# Patient Record
Sex: Female | Born: 1960 | Race: White | Hispanic: No | Marital: Married | Smoking: Current every day smoker
Health system: Southern US, Community
[De-identification: ages and names within clinical notes are randomized; demographics above are authoritative.]

## PROBLEM LIST (undated history)

## (undated) ENCOUNTER — Emergency Department (HOSPITAL_COMMUNITY): Payer: No Typology Code available for payment source

## (undated) DIAGNOSIS — R109 Unspecified abdominal pain: Secondary | ICD-10-CM

## (undated) DIAGNOSIS — E785 Hyperlipidemia, unspecified: Secondary | ICD-10-CM

## (undated) DIAGNOSIS — S3992XA Unspecified injury of lower back, initial encounter: Secondary | ICD-10-CM

## (undated) DIAGNOSIS — K219 Gastro-esophageal reflux disease without esophagitis: Secondary | ICD-10-CM

## (undated) DIAGNOSIS — F329 Major depressive disorder, single episode, unspecified: Secondary | ICD-10-CM

## (undated) DIAGNOSIS — I10 Essential (primary) hypertension: Secondary | ICD-10-CM

## (undated) DIAGNOSIS — Z90710 Acquired absence of both cervix and uterus: Secondary | ICD-10-CM

## (undated) DIAGNOSIS — T8579XA Infection and inflammatory reaction due to other internal prosthetic devices, implants and grafts, initial encounter: Secondary | ICD-10-CM

## (undated) DIAGNOSIS — E739 Lactose intolerance, unspecified: Secondary | ICD-10-CM

## (undated) DIAGNOSIS — K859 Acute pancreatitis without necrosis or infection, unspecified: Secondary | ICD-10-CM

## (undated) DIAGNOSIS — M19041 Primary osteoarthritis, right hand: Secondary | ICD-10-CM

## (undated) DIAGNOSIS — J189 Pneumonia, unspecified organism: Secondary | ICD-10-CM

## (undated) DIAGNOSIS — M25552 Pain in left hip: Secondary | ICD-10-CM

## (undated) DIAGNOSIS — Z978 Presence of other specified devices: Secondary | ICD-10-CM

## (undated) DIAGNOSIS — Z87898 Personal history of other specified conditions: Secondary | ICD-10-CM

## (undated) DIAGNOSIS — M797 Fibromyalgia: Secondary | ICD-10-CM

## (undated) DIAGNOSIS — G8929 Other chronic pain: Secondary | ICD-10-CM

## (undated) DIAGNOSIS — I499 Cardiac arrhythmia, unspecified: Secondary | ICD-10-CM

## (undated) DIAGNOSIS — M19042 Primary osteoarthritis, left hand: Secondary | ICD-10-CM

## (undated) DIAGNOSIS — J45909 Unspecified asthma, uncomplicated: Secondary | ICD-10-CM

## (undated) DIAGNOSIS — E669 Obesity, unspecified: Secondary | ICD-10-CM

## (undated) DIAGNOSIS — M75121 Complete rotator cuff tear or rupture of right shoulder, not specified as traumatic: Secondary | ICD-10-CM

## (undated) DIAGNOSIS — L511 Stevens-Johnson syndrome: Secondary | ICD-10-CM

## (undated) DIAGNOSIS — R32 Unspecified urinary incontinence: Secondary | ICD-10-CM

## (undated) DIAGNOSIS — Z0000 Encounter for general adult medical examination without abnormal findings: Secondary | ICD-10-CM

## (undated) DIAGNOSIS — N393 Stress incontinence (female) (male): Secondary | ICD-10-CM

## (undated) DIAGNOSIS — K602 Anal fissure, unspecified: Secondary | ICD-10-CM

## (undated) DIAGNOSIS — G971 Other reaction to spinal and lumbar puncture: Secondary | ICD-10-CM

## (undated) DIAGNOSIS — A0472 Enterocolitis due to Clostridium difficile, not specified as recurrent: Secondary | ICD-10-CM

## (undated) DIAGNOSIS — Z8371 Family history of colonic polyps: Secondary | ICD-10-CM

## (undated) DIAGNOSIS — R0789 Other chest pain: Secondary | ICD-10-CM

## (undated) DIAGNOSIS — F320 Major depressive disorder, single episode, mild: Secondary | ICD-10-CM

## (undated) DIAGNOSIS — F32A Depression, unspecified: Secondary | ICD-10-CM

## (undated) DIAGNOSIS — J320 Chronic maxillary sinusitis: Secondary | ICD-10-CM

## (undated) HISTORY — DX: Encounter for general adult medical examination without abnormal findings: Z00.00

## (undated) HISTORY — DX: Unspecified asthma, uncomplicated: J45.909

## (undated) HISTORY — DX: Fibromyalgia: M79.7

## (undated) HISTORY — DX: Pneumonia, unspecified organism: J18.9

## (undated) HISTORY — DX: Anal fissure, unspecified: K60.2

## (undated) HISTORY — DX: Hyperlipidemia, unspecified: E78.5

## (undated) HISTORY — DX: Acute pancreatitis without necrosis or infection, unspecified: K85.90

## (undated) HISTORY — DX: Presence of other specified devices: Z97.8

## (undated) HISTORY — DX: Enterocolitis due to Clostridium difficile, not specified as recurrent: A04.72

## (undated) HISTORY — DX: Acquired absence of both cervix and uterus: Z90.710

## (undated) HISTORY — DX: Chronic maxillary sinusitis: J32.0

## (undated) HISTORY — DX: Stevens-Johnson syndrome: L51.1

## (undated) HISTORY — DX: Lactose intolerance, unspecified: E73.9

## (undated) HISTORY — DX: Gastro-esophageal reflux disease without esophagitis: K21.9

## (undated) HISTORY — DX: Unspecified injury of lower back, initial encounter: S39.92XA

## (undated) HISTORY — DX: Primary osteoarthritis, left hand: M19.042

## (undated) HISTORY — DX: Major depressive disorder, single episode, unspecified: F32.9

## (undated) HISTORY — DX: Major depressive disorder, single episode, mild: F32.0

## (undated) HISTORY — DX: Other chronic pain: G89.29

## (undated) HISTORY — DX: Unspecified urinary incontinence: R32

## (undated) HISTORY — DX: Primary osteoarthritis, right hand: M19.041

## (undated) HISTORY — DX: Complete rotator cuff tear or rupture of right shoulder, not specified as traumatic: M75.121

## (undated) HISTORY — DX: Other chest pain: R07.89

## (undated) HISTORY — DX: Depression, unspecified: F32.A

## (undated) HISTORY — DX: Family history of colonic polyps: Z83.71

## (undated) HISTORY — DX: Unspecified abdominal pain: R10.9

## (undated) HISTORY — DX: Stress incontinence (female) (male): N39.3

## (undated) HISTORY — DX: Pain in left hip: M25.552

## (undated) HISTORY — DX: Personal history of other specified conditions: Z87.898

## (undated) HISTORY — DX: Obesity, unspecified: E66.9

## (undated) HISTORY — DX: Infection and inflammatory reaction due to other internal prosthetic devices, implants and grafts, initial encounter: T85.79XA

---
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
## 1980-03-01 DIAGNOSIS — L511 Stevens-Johnson syndrome: Secondary | ICD-10-CM

## 1980-03-01 HISTORY — DX: Stevens-Johnson syndrome: L51.1

## 1983-03-02 HISTORY — PX: ANAL FISSURE REPAIR: SHX2312

## 1985-03-01 HISTORY — PX: APPENDECTOMY: SHX54

## 1988-03-01 DIAGNOSIS — S3992XA Unspecified injury of lower back, initial encounter: Secondary | ICD-10-CM

## 1988-03-01 DIAGNOSIS — G971 Other reaction to spinal and lumbar puncture: Secondary | ICD-10-CM

## 1988-03-01 HISTORY — DX: Other reaction to spinal and lumbar puncture: G97.1

## 1988-03-01 HISTORY — DX: Unspecified injury of lower back, initial encounter: S39.92XA

## 1989-03-01 HISTORY — PX: CHOLECYSTECTOMY: SHX55

## 1995-03-02 HISTORY — PX: ABDOMINOPLASTY: SUR9

## 1995-03-02 HISTORY — PX: BREAST ENHANCEMENT SURGERY: SHX7

## 2001-03-09 ENCOUNTER — Emergency Department (HOSPITAL_COMMUNITY): Admission: EM | Admit: 2001-03-09 | Discharge: 2001-03-09 | Payer: Self-pay | Admitting: Emergency Medicine

## 2001-03-09 ENCOUNTER — Encounter: Payer: Self-pay | Admitting: Emergency Medicine

## 2001-03-29 ENCOUNTER — Encounter: Payer: Self-pay | Admitting: Urology

## 2001-03-29 ENCOUNTER — Encounter: Admission: RE | Admit: 2001-03-29 | Discharge: 2001-03-29 | Payer: Self-pay | Admitting: Urology

## 2001-12-29 ENCOUNTER — Other Ambulatory Visit: Admission: RE | Admit: 2001-12-29 | Discharge: 2001-12-29 | Payer: Self-pay | Admitting: *Deleted

## 2004-07-03 ENCOUNTER — Encounter: Admission: RE | Admit: 2004-07-03 | Discharge: 2004-07-03 | Payer: Self-pay | Admitting: Obstetrics and Gynecology

## 2004-09-21 ENCOUNTER — Other Ambulatory Visit: Admission: RE | Admit: 2004-09-21 | Discharge: 2004-09-21 | Payer: Self-pay | Admitting: Obstetrics and Gynecology

## 2006-05-06 ENCOUNTER — Encounter: Payer: Self-pay | Admitting: Family Medicine

## 2006-05-16 ENCOUNTER — Encounter: Payer: Self-pay | Admitting: Family Medicine

## 2007-08-04 ENCOUNTER — Encounter: Admission: RE | Admit: 2007-08-04 | Discharge: 2007-08-04 | Payer: Self-pay | Admitting: Obstetrics and Gynecology

## 2007-08-04 ENCOUNTER — Encounter: Payer: Self-pay | Admitting: Family Medicine

## 2008-04-22 ENCOUNTER — Ambulatory Visit: Payer: Self-pay | Admitting: Radiology

## 2008-04-22 ENCOUNTER — Emergency Department (HOSPITAL_BASED_OUTPATIENT_CLINIC_OR_DEPARTMENT_OTHER): Admission: EM | Admit: 2008-04-22 | Discharge: 2008-04-22 | Payer: Self-pay | Admitting: Emergency Medicine

## 2009-03-01 HISTORY — PX: BREAST BIOPSY: SHX20

## 2009-05-01 ENCOUNTER — Ambulatory Visit: Payer: Self-pay | Admitting: Diagnostic Radiology

## 2009-05-01 ENCOUNTER — Emergency Department (HOSPITAL_BASED_OUTPATIENT_CLINIC_OR_DEPARTMENT_OTHER): Admission: EM | Admit: 2009-05-01 | Discharge: 2009-05-01 | Payer: Self-pay | Admitting: Emergency Medicine

## 2009-05-01 ENCOUNTER — Encounter: Payer: Self-pay | Admitting: Family Medicine

## 2009-05-02 ENCOUNTER — Encounter: Payer: Self-pay | Admitting: Family Medicine

## 2009-05-02 ENCOUNTER — Encounter: Admission: RE | Admit: 2009-05-02 | Discharge: 2009-05-02 | Payer: Self-pay | Admitting: Emergency Medicine

## 2009-06-02 ENCOUNTER — Ambulatory Visit: Payer: Self-pay | Admitting: Family Medicine

## 2009-06-02 DIAGNOSIS — R5381 Other malaise: Secondary | ICD-10-CM | POA: Insufficient documentation

## 2009-06-02 DIAGNOSIS — R5383 Other fatigue: Secondary | ICD-10-CM

## 2009-06-02 DIAGNOSIS — R51 Headache: Secondary | ICD-10-CM | POA: Insufficient documentation

## 2009-06-02 DIAGNOSIS — M2550 Pain in unspecified joint: Secondary | ICD-10-CM | POA: Insufficient documentation

## 2009-06-02 DIAGNOSIS — R519 Headache, unspecified: Secondary | ICD-10-CM | POA: Insufficient documentation

## 2009-06-02 DIAGNOSIS — R635 Abnormal weight gain: Secondary | ICD-10-CM | POA: Insufficient documentation

## 2009-06-03 ENCOUNTER — Telehealth: Payer: Self-pay | Admitting: Family Medicine

## 2009-06-03 DIAGNOSIS — R32 Unspecified urinary incontinence: Secondary | ICD-10-CM

## 2009-06-03 DIAGNOSIS — F329 Major depressive disorder, single episode, unspecified: Secondary | ICD-10-CM | POA: Insufficient documentation

## 2009-06-03 DIAGNOSIS — J45909 Unspecified asthma, uncomplicated: Secondary | ICD-10-CM | POA: Insufficient documentation

## 2009-06-03 DIAGNOSIS — K219 Gastro-esophageal reflux disease without esophagitis: Secondary | ICD-10-CM | POA: Insufficient documentation

## 2009-06-03 DIAGNOSIS — F3289 Other specified depressive episodes: Secondary | ICD-10-CM

## 2009-06-03 HISTORY — DX: Other specified depressive episodes: F32.89

## 2009-06-03 HISTORY — DX: Major depressive disorder, single episode, unspecified: F32.9

## 2009-06-03 HISTORY — DX: Unspecified asthma, uncomplicated: J45.909

## 2009-06-03 HISTORY — DX: Unspecified urinary incontinence: R32

## 2009-06-30 ENCOUNTER — Ambulatory Visit: Payer: Self-pay | Admitting: Family Medicine

## 2009-06-30 DIAGNOSIS — Z87898 Personal history of other specified conditions: Secondary | ICD-10-CM | POA: Insufficient documentation

## 2009-06-30 DIAGNOSIS — E559 Vitamin D deficiency, unspecified: Secondary | ICD-10-CM | POA: Insufficient documentation

## 2009-06-30 DIAGNOSIS — L03019 Cellulitis of unspecified finger: Secondary | ICD-10-CM | POA: Insufficient documentation

## 2009-06-30 HISTORY — DX: Personal history of other specified conditions: Z87.898

## 2009-07-01 LAB — CONVERTED CEMR LAB
BUN: 14 mg/dL (ref 6–23)
Basophils Absolute: 0 10*3/uL (ref 0.0–0.1)
Basophils Relative: 0.2 % (ref 0.0–3.0)
CO2: 29 meq/L (ref 19–32)
Calcium: 9 mg/dL (ref 8.4–10.5)
Chloride: 105 meq/L (ref 96–112)
Creatinine, Ser: 0.6 mg/dL (ref 0.4–1.2)
Eosinophils Absolute: 0.1 10*3/uL (ref 0.0–0.7)
Eosinophils Relative: 1.2 % (ref 0.0–5.0)
GFR calc non Af Amer: 112.8 mL/min (ref >60)
Glucose, Bld: 84 mg/dL (ref 70–99)
HCT: 38.3 % (ref 36.0–46.0)
Hemoglobin: 13.1 g/dL (ref 12.0–15.0)
Lymphocytes Relative: 20.3 % (ref 12.0–46.0)
Lymphs Abs: 2.4 10*3/uL (ref 0.7–4.0)
MCHC: 34.3 g/dL (ref 30.0–36.0)
MCV: 93 fL (ref 78.0–100.0)
Monocytes Absolute: 0.6 10*3/uL (ref 0.1–1.0)
Monocytes Relative: 5 % (ref 3.0–12.0)
Neutro Abs: 8.7 10*3/uL — ABNORMAL HIGH (ref 1.4–7.7)
Neutrophils Relative %: 73.3 % (ref 43.0–77.0)
Platelets: 351 10*3/uL (ref 150.0–400.0)
Potassium: 3.5 meq/L (ref 3.5–5.1)
RBC: 4.11 M/uL (ref 3.87–5.11)
RDW: 13.2 % (ref 11.5–14.6)
Sodium: 140 meq/L (ref 135–145)
TSH: 1.38 microintl units/mL (ref 0.35–5.50)
Vit D, 25-Hydroxy: 97 ng/mL — ABNORMAL HIGH (ref 30–89)
Vitamin B-12: 597 pg/mL (ref 211–911)
WBC: 11.8 10*3/uL — ABNORMAL HIGH (ref 4.5–10.5)

## 2009-07-03 ENCOUNTER — Encounter: Admission: RE | Admit: 2009-07-03 | Discharge: 2009-07-03 | Payer: Self-pay | Admitting: Family Medicine

## 2009-07-30 ENCOUNTER — Telehealth: Payer: Self-pay | Admitting: Family Medicine

## 2009-08-27 ENCOUNTER — Ambulatory Visit: Payer: Self-pay | Admitting: Family Medicine

## 2009-08-27 DIAGNOSIS — J320 Chronic maxillary sinusitis: Secondary | ICD-10-CM

## 2009-08-27 HISTORY — DX: Chronic maxillary sinusitis: J32.0

## 2009-08-28 ENCOUNTER — Encounter: Payer: Self-pay | Admitting: Family Medicine

## 2009-09-03 ENCOUNTER — Telehealth: Payer: Self-pay | Admitting: Family Medicine

## 2009-09-17 ENCOUNTER — Ambulatory Visit: Payer: Self-pay | Admitting: Family Medicine

## 2009-09-17 LAB — CONVERTED CEMR LAB: Anti Nuclear Antibody(ANA): NEGATIVE

## 2009-09-19 LAB — CONVERTED CEMR LAB
Basophils Absolute: 0 10*3/uL (ref 0.0–0.1)
Basophils Relative: 0.4 % (ref 0.0–3.0)
Eosinophils Absolute: 0.2 10*3/uL (ref 0.0–0.7)
Eosinophils Relative: 1.4 % (ref 0.0–5.0)
HCT: 41 % (ref 36.0–46.0)
Hemoglobin: 14.1 g/dL (ref 12.0–15.0)
Lymphocytes Relative: 18.4 % (ref 12.0–46.0)
Lymphs Abs: 2.3 10*3/uL (ref 0.7–4.0)
MCHC: 34.3 g/dL (ref 30.0–36.0)
MCV: 92.5 fL (ref 78.0–100.0)
Monocytes Absolute: 0.8 10*3/uL (ref 0.1–1.0)
Monocytes Relative: 6.3 % (ref 3.0–12.0)
Neutro Abs: 9 10*3/uL — ABNORMAL HIGH (ref 1.4–7.7)
Neutrophils Relative %: 73.5 % (ref 43.0–77.0)
Platelets: 362 10*3/uL (ref 150.0–400.0)
RBC: 4.43 M/uL (ref 3.87–5.11)
RDW: 12.8 % (ref 11.5–14.6)
Rhuematoid fact SerPl-aCnc: <20 intl units/mL (ref 0–20)
Sed Rate: 15 mm/hr (ref 0–22)
WBC: 12.3 10*3/uL — ABNORMAL HIGH (ref 4.5–10.5)

## 2009-09-24 ENCOUNTER — Telehealth: Payer: Self-pay | Admitting: Family Medicine

## 2009-10-01 ENCOUNTER — Ambulatory Visit: Payer: Self-pay | Admitting: Family Medicine

## 2009-10-01 DIAGNOSIS — Z978 Presence of other specified devices: Secondary | ICD-10-CM

## 2009-10-01 HISTORY — DX: Presence of other specified devices: Z97.8

## 2009-10-02 ENCOUNTER — Telehealth: Payer: Self-pay | Admitting: Family Medicine

## 2009-10-02 ENCOUNTER — Encounter: Payer: Self-pay | Admitting: Family Medicine

## 2009-10-03 ENCOUNTER — Telehealth: Payer: Self-pay | Admitting: Family Medicine

## 2009-10-13 ENCOUNTER — Telehealth: Payer: Self-pay | Admitting: Family Medicine

## 2009-10-20 ENCOUNTER — Telehealth: Payer: Self-pay | Admitting: Family Medicine

## 2009-11-05 ENCOUNTER — Telehealth: Payer: Self-pay | Admitting: Family Medicine

## 2009-12-18 ENCOUNTER — Ambulatory Visit: Payer: Self-pay | Admitting: Family Medicine

## 2009-12-18 LAB — CONVERTED CEMR LAB
Bilirubin Urine: NEGATIVE
Blood in Urine, dipstick: NEGATIVE
Glucose, Urine, Semiquant: NEGATIVE
Ketones, urine, test strip: NEGATIVE
Nitrite: NEGATIVE
Protein, U semiquant: NEGATIVE
Specific Gravity, Urine: >=1.03
Urobilinogen, UA: 0.2
WBC Urine, dipstick: NEGATIVE
pH: 5

## 2010-03-31 NOTE — Assessment & Plan Note (Signed)
Summary: 1 month fup//ccm   Vital Signs:  Patient profile:   50 year old female Menstrual status:  regular Temp:     98.4 degrees F oral BP sitting:   140 / 88  (left arm) Cuff size:   regular  Vitals Entered By: Sid Falcon LPN (Jun 30, 2593 1:36 PM) CC: one month follow-up   History of Present Illness: Patient seen with multiple complaints. Somewhat complex past medical history. She relates several years ago having problems with some neuropathy type issues involving extremities and face had neurology workup in 2008 with MRI scan which was unrevealing.  She relates for the past year if not longer she has had some progressive numbness both lower legs from the feet centrally and dorsally and toward the knee. Symptoms have been somewhat progressive. Also relates frequent headaches right parietal area which are sharp transient usually lasting 20 seconds and very intense. No clear exacerbating or alleviating factors. No associated nausea or vomiting. Possible decrease in short-term memory.  She has prior history of saline breast implants and states that she has a silicone shell. She is convinced that some of her symptoms are related to that. She has scheduled followup with plastic surgeon later this week to discuss issues of removal.  Recent arthropathy pains and muscular pains are improved on Naproxen.   Reported hx severe Vit D defic.  Needs f/u.    Other new acute issues last week right middle finger pain .   Had "hang-nail" which she tried to  remove. Subsequent infection and expressed lots of purulence earlier today and yesterday. Slightly improved but still swollen and tender.  Allergies: 1)  ! Ferrous Sulfate (Ferrous Sulfate)  Past History:  Past Medical History: Last updated: 06/02/2009 Asthma Depression GERD Urinary incontinence (stress)  Past Surgical History: Last updated: 06/02/2009 Cholecystectomy  1991 Tonsillectomy  1978 Breast Bx  2011 Appendectomy   1987 Breast Implants 1997 Anal Fissure 1985 Tummy Tuck 1997  Family History: Last updated: 06/02/2009 CAD mother 53 CVA  both parents ? age Type 2 diabetes both parents HTN   both parents  Social History: Last updated: 06/02/2009 Married Current Smoker Alcohol use-yes  Risk Factors: Smoking Status: current (06/02/2009)  Review of Systems       The patient complains of weight gain.  The patient denies anorexia, fever, weight loss, vision loss, hoarseness, chest pain, syncope, dyspnea on exertion, peripheral edema, prolonged cough, hemoptysis, abdominal pain, melena, hematochezia, severe indigestion/heartburn, hematuria, muscle weakness, and enlarged lymph nodes.    Physical Exam  General:  Well-developed,well-nourished,in no acute distress; alert,appropriate and cooperative throughout examination Head:  Normocephalic and atraumatic without obvious abnormalities. No apparent alopecia or balding. Eyes:  No corneal or conjunctival inflammation noted. EOMI. Perrla. Funduscopic exam benign, without hemorrhages, exudates or papilledema. Vision grossly normal. Ears:  External ear exam shows no significant lesions or deformities.  Otoscopic examination reveals clear canals, tympanic membranes are intact bilaterally without bulging, retraction, inflammation or discharge. Hearing is grossly normal bilaterally. Neck:  No deformities, masses, or tenderness noted. Lungs:  Normal respiratory effort, chest expands symmetrically. Lungs are clear to auscultation, no crackles or wheezes. Heart:  Normal rate and regular rhythm. S1 and S2 normal without gallop, murmur, click, rub or other extra sounds. Extremities:  No clubbing, cyanosis, edema, or deformity noted with normal full range of motion of all joints.  right middle finger reveals mild swelling along the border of the nail and mild erythema but no purulence and no fluctuance. Minimally  tender Neurologic:  alert & oriented X3, cranial nerves  II-XII intact, strength normal in all extremities, gait normal, and finger-to-nose normal.  she has some subjective sensory deficit to touch dorsum of foot and lower leg bilaterally   Impression & Recommendations:  Problem # 1:  DYSESTHESIA (ICD-782.0) she describes sensory defecits bilateral mostly lower extremities and needs some basic labs with B12 and thyroid.  MRI brain and may need to consider neurology referral. Orders: Radiology Referral (Radiology) Venipuncture [phone]) TLB-B12, Serum-Total ONLY ([id number])  Problem # 2:  HEADACHE (ICD-784.0) Assessment: Deteriorated Atypical unilateral very localized headache in pt who reports possibly some mild congnitive decline.  MRI to assess. Her updated medication list for this problem includes:    Naproxen 500 Mg Tabs (Naproxen) ..... One by mouth two times a day with food as needed pain  Problem # 3:  WEIGHT GAIN (ICD-783.1) Assessment: New check thyroid. Orders: Venipuncture (34742) TLB-TSH (Thyroid Stimulating Hormone) (84443-TSH)  Problem # 4:  PARONYCHIA, FINGER (ICD-681.02) Assessment: New Suspect abscess has already been largely drained.   No fluctuance. warm soaks and start Keflex  Her updated medication list for this problem includes:    Cephalexin 500 Mg Caps (Cephalexin) ..... One by mouth three times a day for 7 days  Problem # 5:  VITAMIN D DEFICIENCY (ICD-268.9)  Orders: T-Vitamin D (25-Hydroxy) ([id number]) Venipuncture (33295)  Complete Medication List: 1)  Naproxen 500 Mg Tabs (Naproxen) .... One by mouth two times a day with food as needed pain 2)  Cephalexin 500 Mg Caps (Cephalexin) .... One by mouth three times a day for 7 days  Other Orders: TLB-CBC Platelet - w/Differential (85025-CBCD) TLB-BMP (Basic Metabolic Panel-BMET) (80048-METABOL)  Patient Instructions: 1)  continue warm salt water soaks to right middle finger several times daily. 2)  Call you regarding MRI scan of the  brain. Prescriptions: CEPHALEXIN 500 MG CAPS (CEPHALEXIN) one by mouth three times a day for 7 days  #21 x 0   Entered and Authorized by:   Evelena Peat MD   Signed by:   Evelena Peat MD on 06/30/2009   Method used:   Electronically to        [facility] [phone]* (retail)       [address]       [place], [place]  [address]       Ph: [phone] or [phone]       Fax: [phone]   RxID:   [id number]

## 2010-03-31 NOTE — Progress Notes (Signed)
Summary: ? about labs, additional immune panel?  Phone Note Call from Patient   Caller: Patient Call For: Evelena Peat MD Summary of Call: Pt went to [facility] for labs, and wants to talk to [place] re:  what labs are ordered?? [phone]  Patient Lab [phone] Initial call taken by: Lynann Beaver [name],  October 02, 2009 11:05 AM  Follow-up for Phone Call        We wrote on prescription pad order for heavy metal assay and lymphocyte chemical sensitivity test for silicone AS PER PT REQUEST. Follow-up by: Evelena Peat MD,  October 02, 2009 1:07 PM  Additional Follow-up for Phone Call Additional follow up Details #1::        Wants to know if any immune studies were ordered, and if not, she would like to have these ordererd per recommendations of [facility]?  Would like [place] to call the lab????  I also spoke with pt, [place] lab can use yesterdays blood for additional immune panel if called today to [phone] Sid Falcon LPN  October 02, 2009 4:10 PM  Additional Follow-up by: Lynann Beaver [name],  October 02, 2009 2:01 PM    Additional Follow-up for Phone Call Additional follow up Details #2::    I am not aware of any other studies to order.  At this point, if she is unsatisfied with what we've ordered she will need to see someone who has expertise in dealing with breast implant issues. Follow-up by: Evelena Peat MD,  October 02, 2009 5:13 PM  Additional Follow-up for Phone Call Additional follow up Details #3:: Details for Additional Follow-up Action Taken: No answer, and no voice mail.  Patient aware.  Rudy Jew, RN  October 03, 2009 9:11 AM  Additional Follow-up by: Lynann Beaver [name],  October 03, 2009 9:10 AM

## 2010-03-31 NOTE — Assessment & Plan Note (Signed)
Summary: BRAND NEW PT/TO EST/CJR   Vital Signs:  Patient profile:   50 year old female Menstrual status:  regular LMP:     05/01/2009 Height:      59.25 inches Weight:      141 pounds BMI:     28.34 Temp:     99.2 degrees F oral Pulse rate:   100 / minute Pulse rhythm:   regular Resp:     12 per minute BP sitting:   140 / 88  (left arm) Cuff size:   regular  Vitals Entered By: Sid Falcon LPN (June 02, 1608 2:16 PM)  Nutrition Counseling: Patient's BMI is greater than 25 and therefore counseled on weight management options. CC: New to establish, Headaches LMP (date): 05/01/2009     Menstrual Status regular Enter LMP: 05/01/2009   History of Present Illness: Patient is new to establish care.  Seen today with multiple complaints. She states she's gained 15 pounds over the past one month. Complains of excessive fatigue for several months and her main complaint is arthralgias involving multiple joints and progressive over several months. No obvious swelling or erythema. She mentions her hands, elbows, knees mostly. Went to emergency room in [facility] and had some sort of lab work about a month ago. Has taken Tylenol without much improvement. No family history of inflammatory arthritis.  Patient also relates several other factors including 15 pound reported weight gain past month without increased edema issues. She has occasional resting tremor. Concerns for possible transient memory disturbance. She states she has occasional difficulty finding appropriate words. Also complains of some left lower extremity numbness for several months. Frequent right temporal and parietal headache for about one year duration but not progressive. Reported prior history of neuropathic symptoms and states 3 years ago she had nerve conduction studies of extremities through neurologist which were normal.  Occasional reported transient diplopia. No blurred vision.  Patient relates prior history of  depression. History of elevated blood pressure readings but never treated for hypertension. Previous surgeries as outlined.  Family history is significant for parents having heart disease, stroke, hyperlipidemia, and type 2 diabetes.  Patient is married. Smokes about one half packs years per day. Occasional alcohol use.  Headaches      This is a 50 year old woman who presents with Headaches.  The patient denies nausea, vomiting, sweats, nasal congestion, sinus pain, and photophobia.  The headache is described as intermittent.  The location of the pain is bitemporal.  The patient denies the following high-risk features: fever, neck pain/stiffness, focal weakness, trauma, and pain worse with exertion.  Prior treatment has included acetaminophen.    Preventive Screening-Counseling & Management  Alcohol-Tobacco     Smoking Status: current  Allergies (verified): 1)  ! Albuterol Sulfate (Albuterol Sulfate)  Past History:  Family History: Last updated: 06/02/2009 CAD mother 67 CVA  both parents ? age Type 2 diabetes both parents HTN   both parents  Social History: Last updated: 06/02/2009 Married Current Smoker Alcohol use-yes  Risk Factors: Smoking Status: current (06/02/2009)  Past Medical History: Asthma Depression GERD Urinary incontinence (stress)  Past Surgical History: Cholecystectomy  1991 Tonsillectomy  1978 Breast Bx  2011 Appendectomy  1987 Breast Implants 1997 Anal Fissure 1985 Tummy Tuck 1997  Family History: CAD mother 11 CVA  both parents ? age Type 2 diabetes both parents HTN   both parents  Social History: Married Current Smoker Alcohol use-yes Smoking Status:  current  Review of Systems  The patient complains of weight gain and headaches.  The patient denies anorexia, fever, weight loss, chest pain, syncope, dyspnea on exertion, peripheral edema, prolonged cough, hemoptysis, abdominal pain, melena, hematochezia, severe  indigestion/heartburn, hematuria, incontinence, muscle weakness, and depression.    Physical Exam  General:  Well-developed,well-nourished,in no acute distress; alert,appropriate and cooperative throughout examination Head:  Normocephalic and atraumatic without obvious abnormalities. No apparent alopecia or balding. Eyes:  pupils equal, pupils round, and pupils reactive to light.   Ears:  External ear exam shows no significant lesions or deformities.  Otoscopic examination reveals clear canals, tympanic membranes are intact bilaterally without bulging, retraction, inflammation or discharge. Hearing is grossly normal bilaterally. Mouth:  Oral mucosa and oropharynx without lesions or exudates.  Teeth in good repair. Neck:  No deformities, masses, or tenderness noted. Lungs:  Normal respiratory effort, chest expands symmetrically. Lungs are clear to auscultation, no crackles or wheezes. Heart:  Normal rate and regular rhythm. S1 and S2 normal without gallop, murmur, click, rub or other extra sounds. Msk:  normal ROM, no joint tenderness, no joint swelling, no joint warmth, no redness over joints, no joint deformities, and no muscle atrophy.   Extremities:  no objective signs of joint redness or warmth or effusion. No performed edema or clubbing.she has a few tender trigger points but does not meet diagnostic criteria for fibromyalgia Neurologic:  alert & oriented X3, cranial nerves II-XII intact, strength normal in all extremities, and gait normal.   Skin:  Intact without suspicious lesions or rashes Cervical Nodes:  No lymphadenopathy noted Psych:  normally interactive, good eye contact, not anxious appearing, and not depressed appearing.     Impression & Recommendations:  Problem # 1:  ARTHRALGIA (ICD-719.40) Assessment New multiple joints   Apparently no prior w/u.  Not meet diagnostic criteria for fibromyalgia.  Naproxen prescribed.  Problem # 2:  HEADACHE (ICD-784.0) Assessment:  New consider MRI or CT to further assess but pt wishes to wait at this time secondary to lack of insurance.  Her updated medication list for this problem includes:    Naproxen 500 Mg Tabs (Naproxen) ..... One by mouth two times a day with food as needed pain  Problem # 3:  FATIGUE (ICD-780.79) Assessment: New ?etiology.  Send for recent labs and may need additional.  Problem # 4:  DYSESTHESIA (ICD-782.0) ?etiology.  Some of these sound more chronic.  No clear episodic pattern.  Consider MRI to further assess.  Problem # 5:  WEIGHT GAIN (ICD-783.1) Assessment: New needs TSH if not already done.  Will send for recent labs first.  Complete Medication List: 1)  Naproxen 500 Mg Tabs (Naproxen) .... One by mouth two times a day with food as needed pain  Patient Instructions: 1)  Please schedule a follow-up appointment in 1 month.  Prescriptions: NAPROXEN 500 MG TABS (NAPROXEN) one by mouth two times a day with food as needed pain  #40 x 0   Entered and Authorized by:   Evelena Peat MD   Signed by:   Evelena Peat MD on 06/02/2009   Method used:   Print then Give to Patient   RxID:   [id number]

## 2010-03-31 NOTE — Progress Notes (Signed)
Summary: Heavy Metal screening neg  Phone Note Outgoing Call   Call placed by: Sid Falcon LPN,  October 13, 2009 10:49 AM Call placed to: Patient Summary of Call: Pt informed lab screening for heavy metal was negative.  Will scan paper report to EMR Initial call taken by: Sid Falcon LPN,  October 13, 2009 10:51 AM

## 2010-03-31 NOTE — Assessment & Plan Note (Signed)
Summary: PAIN IN LFT BREAST/SWOLLEN LYMPH NODE/PAINFUL/CJR   Vital Signs:  Patient profile:   50 year old female Menstrual status:  regular Temp:     98 degrees F oral BP sitting:   140 / 90  Vitals Entered By: Sid Falcon LPN (August 27, 2009 2:00 PM)  History of Present Illness: Patient seen for the following.  Long history of left breast pain. History of silicone breast implants. Recently saw a Engineer, petroleum and desires to have these removed but this was considered cosmetic. She is considering a second opinion at this time. She had mammogram in February and aspiration of cyst. Pain is left lateral breast region. Again she's had breast pain off and on for several years. She is convinced silicone may have some role in this.  She has not noted any distinct masses.  Complains of frequent right naris ulceration/ blisters. Pain along the medial septum. No nosebleeds. Used Bactroban ointment by prior physician without improvement. She requests culture today. No history of MRSA.  Intermittent dysesthesias extremities for quite some time. Had prior workup with MRI. Dysesthesias are very inconsistent and are migratory and involve multiple extremities. No clear pattern.  Recent B12 and TSH normal.  No diabetes.  Has been seen by neurologist in [facility] in past for similar and no clear etilogy found.  She would like to see someone locally regarding this.  Allergies: 1)  ! Ferrous Sulfate (Ferrous Sulfate)  Past History:  Past Medical History: Last updated: 06/02/2009 Asthma Depression GERD Urinary incontinence (stress)  Past Surgical History: Last updated: 06/02/2009 Cholecystectomy  1991 Tonsillectomy  1978 Breast Bx  2011 Appendectomy  1987 Breast Implants 1997 Anal Fissure 1985 Tummy Tuck 1997  Family History: Last updated: 06/02/2009 CAD mother 25 CVA  both parents ? age Type 2 diabetes both parents HTN   both parents  Social History: Last updated:  06/02/2009 Married Current Smoker Alcohol use-yes  Risk Factors: Smoking Status: current (06/02/2009) PMH-FH-SH reviewed for relevance  Review of Systems  The patient denies anorexia, weight loss, weight gain, chest pain, syncope, dyspnea on exertion, peripheral edema, prolonged cough, headaches, hemoptysis, and abdominal pain.    Physical Exam  General:  Well-developed,well-nourished,in no acute distress; alert,appropriate and cooperative throughout examination Neck:  No deformities, masses, or tenderness noted. Breasts:  L breast reveals no masses or axillary adenopathy.  No nipple inversion or nipple discharge. Slightly tender lateral breast around 2 O'clock position. Lungs:  Normal respiratory effort, chest expands symmetrically. Lungs are clear to auscultation, no crackles or wheezes. Heart:  Normal rate and regular rhythm. S1 and S2 normal without gallop, murmur, click, rub or other extra sounds. Neurologic:  alert & oriented X3, cranial nerves II-XII intact, strength normal in all extremities, sensation intact to light touch, gait normal, and DTRs symmetrical and normal.   Skin:  no rashes.   Cervical Nodes:  No lymphadenopathy noted   Impression & Recommendations:  Problem # 1:  OTHER DISEASES OF NASAL CAVITY AND SINUSES (ICD-478.19) pt complains of persistent pain and "ulcerations" R nasal cavity but no ulcers noted at this time.  She requests nasal cx and swab taken. Orders: T-Culture, Wound ([id number])  Problem # 2:  BREAST PAIN (ICD-611.71) Chronic.  She is encouraged to get second opinion and will consult with her GYN for their opinion of plastic surgeon.  Problem # 3:  PARESTHESIA (ICD-782.0)  these are somewhat chronic with unrevealing w/u in past .  Will refer to local neurology group for second opinion.  No evidence for demyelinating disease on recent MRI. Orders: Neurology Referral (Neuro)  Complete Medication List: 1)  Naproxen 500 Mg Tabs  (Naproxen) .... One by mouth two times a day with food as needed pain  Patient Instructions: 1)  we will call you with appointment with neurology group. 2)  We will call you regarding culture results

## 2010-03-31 NOTE — Progress Notes (Signed)
Summary: returning a call  Phone Note Call from Patient Call back at Work Phone [phone]   Caller: Patient---live call Reason for Call: Talk to Doctor Summary of Call: Returning Dr Lucie Leather call. Initial call taken by: Warnell Forester,  June 03, 2009 10:27 AM  Follow-up for Phone Call        spoke with pt .  Reviewed labs she had recently through ER.  She prefers to wait for further labs or consideration of neurologic testing until f/u. Follow-up by: Evelena Peat MD,  June 03, 2009 1:20 PM

## 2010-03-31 NOTE — Progress Notes (Signed)
Summary: re implants & problems  Phone Note Call from Patient Call back at [facility] Phone [phone]   Summary of Call: The name of test to detect silicone is called lymphocyte chemical sensitivity test for silicone.  Internet says to send to [employer].  FDA requires that when a woman has breastimplants & pain, any changes, that shey be tested for alcl, anaplastic large cell lymphoma & results have to be sent to FDA & manufacturer which is [employer].  FDA website for breast implants.  Wants this done ASAP.  She is getting worse.  Call her with questions or with Dr. Senaida Lange advice.    Initial call taken by: Rudy Jew, RN,  September 24, 2009 2:52 PM  Follow-up for Phone Call        pt's concerns noted.  She needs to see specialist who has expertice with silicone breast implant complications and she is looking at possible options for referral. Follow-up by: Evelena Peat MD,  October 01, 2009 5:21 PM

## 2010-03-31 NOTE — Progress Notes (Signed)
Summary: [place] labs copy requested  Phone Note Call from Patient   Caller: Patient Call For: Evelena Peat MD Summary of Call: Wants lab results from [facility]. [phone] Initial call taken by: Lynann Beaver [name],  October 20, 2009 4:53 PM  Follow-up for Phone Call        Left message on personally identified [facility] labs have not been scanned into her EMR yet, suggested she call [facility] personally, phone number provided Follow-up by: Sid Falcon LPN,  October 21, 2009 9:02 AM

## 2010-03-31 NOTE — Assessment & Plan Note (Signed)
Summary: Arthritis problems in hands, labs, referral?/nn   Vital Signs:  Patient profile:   50 year old female Menstrual status:  regular Weight:      141 pounds Temp:     97.9 degrees F oral BP sitting:   148 / 90  (left arm) Cuff size:   regular  Vitals Entered By: Sid Falcon LPN (September 17, 2009 1:38 PM)  Serial Vital Signs/Assessments:  Time      Position  BP       Pulse  Resp  Temp     By                     120/84                         Evelena Peat MD   History of Present Illness: Patient seen with polyarthralgias.  She has a history of breast implants and is fairly convinced that several of her symptoms are related to this. She has sought out opinion of several surgeons. Her current symptoms include joint arthralgias with progressive involvement of hands, wrists, elbows, knees, and ankles over several months. She has fairly symmetric involvement. Minimal swelling. She reports some mild erythema. Early morning joint stiffness which eventually improves. Pain of moderate severity. No rashes. No family history of inflammatory arthritis.  She continues to have intermittent dysesthesias and paresthesias of the extremities. Recent MRI of the brain unremarkable. She is in process of trying to find out which neurologists accept her insurance.  Also complaining of some chest wall muscle spasms/contractions in recent weeks.  No exertional symptoms and worse at night.  No exacerbating features.    Allergies: 1)  ! Ferrous Sulfate (Ferrous Sulfate)  Past History:  Past Medical History: Last updated: 06/02/2009 Asthma Depression GERD Urinary incontinence (stress)  Past Surgical History: Last updated: 06/02/2009 Cholecystectomy  1991 Tonsillectomy  1978 Breast Bx  2011 Appendectomy  1987 Breast Implants 1997 Anal Fissure 1985 Tummy Tuck 1997  Family History: Last updated: 06/02/2009 CAD mother 48 CVA  both parents ? age Type 2 diabetes both parents HTN   both  parents  Social History: Last updated: 06/02/2009 Married Current Smoker Alcohol use-yes  Risk Factors: Smoking Status: current (06/02/2009) PMH-FH-SH reviewed for relevance  Review of Systems  The patient denies anorexia, fever, weight loss, chest pain, syncope, dyspnea on exertion, peripheral edema, abdominal pain, melena, hematochezia, and depression.    Physical Exam  General:  Well-developed,well-nourished,in no acute distress; alert,appropriate and cooperative throughout examination Mouth:  Oral mucosa and oropharynx without lesions or exudates.  Teeth in good repair. Neck:  No deformities, masses, or tenderness noted. Lungs:  Normal respiratory effort, chest expands symmetrically. Lungs are clear to auscultation, no crackles or wheezes. Heart:  Normal rate and regular rhythm. S1 and S2 normal without gallop, murmur, click, rub or other extra sounds. Msk:  No deformity or scoliosis noted of thoracic or lumbar spine.   Extremities:  patient has no visible joint erythema or swelling or warmth. Minimal tenderness involving the PIP joints of both hands. She has excellent range of motion wrist elbows shoulders knees ankles bilaterally Neurologic:  alert & oriented X3, cranial nerves II-XII intact, strength normal in all extremities, sensation intact to light touch, and gait normal.   Skin:  no rashes.   Cervical Nodes:  No lymphadenopathy noted Psych:  normally interactive, good eye contact, and not depressed appearing.     Impression & Recommendations:  Problem # 1:  ARTHRALGIA (ICD-719.40)  patient describes multiple arthralgias which are fairly symmetric. Obtain screening lab work. Consider rheumatology referral  Orders: TLB-Rheumatoid Factor (RA) (16109-UE) T-Antinuclear Antib (ANA) [phone]) Venipuncture (47829) Specimen Handling (56213) TLB-CBC Platelet - w/Differential (85025-CBCD) TLB-Sedimentation Rate (ESR) (85652-ESR)  Problem # 2:  PARESTHESIA  (ICD-782.0) pt will check to see which local neurologists accept her insurance.  Complete Medication List: 1)  Naproxen 500 Mg Tabs (Naproxen) .... One by mouth two times a day with food as needed pain  Patient Instructions: 1)  We will call you regarding lab work results. 2)  Call back regarding neurology coverage.

## 2010-03-31 NOTE — Progress Notes (Signed)
Summary: Has the records from [facility] arrived?  Phone Note Call from Patient Call back at Home Phone [phone]   Caller: Patient Call For: Rose Peat MD Summary of Call: VM from pt asking if the file is ready to pick-up from Dr Westley Chandler from [facility]. Initial call taken by: Sid Falcon LPN,  November 05, 2009 8:47 AM  Follow-up for Phone Call        I know nothing about any records from [facility]. Follow-up by: Rose Peat MD,  November 05, 2009 8:44 PM  Additional Follow-up for Phone Call Additional follow up Details #1::        Spoke with pt, she has obtained the information Additional Follow-up by: Sid Falcon LPN,  November 07, 2009 11:48 AM

## 2010-03-31 NOTE — Progress Notes (Signed)
Summary: wants nancy to call about misunderstanding  Phone Note Call from Patient Call back at Home Phone [phone]   Call For: nancy Summary of Call: Very important that she returns her call, because it seems to be that there has been a misunderstanding.   Initial call taken by: Rudy Jew, RN,  October 03, 2009 10:31 AM  Follow-up for Phone Call        Spoke with pt, she just did not want to be poked again if more testing needed to be done with her blood.  Shsi is not unhappy with Dr Lucie Leather carre.  Explained he wanted her to know it would be OK if she wanted to seek out a a provider with expertice in Immunology.  Pt asked if the surgeon in [place] could order additional studies, pt was instructed to contact that office. Follow-up by: Sid Falcon LPN,  October 03, 2009 11:22 AM  Additional Follow-up for Phone Call Additional follow up Details #1::        noted Additional Follow-up by: Evelena Peat MD,  October 03, 2009 1:01 PM

## 2010-03-31 NOTE — Progress Notes (Signed)
Summary: questions about sinus issues on MRI  Phone Note Call from Patient   Caller: Patient Call For: Evelena Peat MD Summary of Call: Reviewed with pt about chronic and acute infections as related to her MRI. Initial call taken by: Lynann Beaver [name],  July 30, 2009 4:01 PM  Follow-up for Phone Call        again reviewed results with pt.  she has no nasal congestion, fever, headaches (currently), sore throat, or any other predictors of acute sinusitis.  We discussed option of antibiotic rx but she is reluctant since she is asymptomatic. Follow-up by: Evelena Peat MD,  July 30, 2009 6:19 PM

## 2010-03-31 NOTE — Assessment & Plan Note (Signed)
Summary: SINUSITIS? // RS   Vital Signs:  Patient profile:   50 year old female Menstrual status:  regular Weight:      146 pounds Temp:     98.6 degrees F oral BP sitting:   120 / 80  (left arm) Cuff size:   regular  Vitals Entered By: Sid Falcon LPN (December 18, 2009 3:17 PM)  History of Present Illness: Patient seen with over one week history of right frontal and maxillary sinus pressure. Increased nasal congestion. Question of low-grade fever. No cough. Mild sore throat. Took over-the-counter Advil without much improvement.  Also complains of one-week history of right lower lumbar back pain. No injury. No dysuria. She is concerned about possibility of kidney infection. Again no significant documented fever but question of some chills and low-grade fever. No urine frequency. Pain worse with prolonged standing. No alleviating factors. No radiculopathy symptoms. No numbness or weakness.  Allergies: 1)  ! Ferrous Sulfate (Ferrous Sulfate)  Past History:  Past Medical History: Last updated: 06/02/2009 Asthma Depression GERD Urinary incontinence (stress)  Review of Systems      See HPI  Physical Exam  General:  Well-developed,well-nourished,in no acute distress; alert,appropriate and cooperative throughout examination Ears:  External ear exam shows no significant lesions or deformities.  Otoscopic examination reveals clear canals, tympanic membranes are intact bilaterally without bulging, retraction, inflammation or discharge. Hearing is grossly normal bilaterally. Nose:  External nasal examination shows no deformity or inflammation. Nasal mucosa are pink and moist without lesions or exudates. Mouth:  Oral mucosa and oropharynx without lesions or exudates.  Teeth in good repair. Neck:  No deformities, masses, or tenderness noted. Lungs:  Normal respiratory effort, chest expands symmetrically. Lungs are clear to auscultation, no crackles or wheezes. Heart:  Normal rate and  regular rhythm. S1 and S2 normal without gallop, murmur, click, rub or other extra sounds. Msk:  tender right lower lumbar region. Straight leg raise negative   Impression & Recommendations:  Problem # 1:  SINUSITIS, ACUTE (ICD-461.9)  treat with azithromycin.  Her updated medication list for this problem includes:    Azithromycin 250 Mg Tabs (Azithromycin) .Marland Kitchen... 2 by mouth today then one by mouth once daily for 4 days  Problem # 2:  LUMBAGO (ICD-724.2)  suspect muscular. Will check urinalysis. Her updated medication list for this problem includes:    Naproxen 500 Mg Tabs (Naproxen) ..... One by mouth two times a day with food as needed pain  Orders: UA Dipstick w/o Micro (manual) (16109)  Complete Medication List: 1)  Naproxen 500 Mg Tabs (Naproxen) .... One by mouth two times a day with food as needed pain 2)  Vitamin D 400 Unit Caps (Cholecalciferol) .... Once daily 3)  Azithromycin 250 Mg Tabs (Azithromycin) .... 2 by mouth today then one by mouth once daily for 4 days  Patient Instructions: 1)  Acute sinusitis symptoms for less than 10 days are not helped by antibiotics. Use warm moist compresses, and over the counter decongestants( only as directed). Call if no improvement in 5-7 days, sooner if increasing pain, fever, or new symptoms.  Prescriptions: AZITHROMYCIN 250 MG TABS (AZITHROMYCIN) 2 by mouth today then one by mouth once daily for 4 days  #6 x 0   Entered and Authorized by:   Evelena Peat MD   Signed by:   Evelena Peat MD on 12/18/2009   Method used:   Electronically to        [facility]  [address] [id number] (retail)  [address]       [place], [place]  [address]       Ph: [phone] or [phone]       Fax: [phone]   RxID:   [id number]    Orders Added: 1)  UA Dipstick w/o Micro (manual) [81002] 2)  Est. Patient Level III [94709]    Laboratory Results   Urine Tests    Routine Urinalysis   Color: yellow Appearance: Clear Glucose:  negative   (Normal Range: Negative) Bilirubin: negative   (Normal Range: Negative) Ketone: negative   (Normal Range: Negative) Spec. Gravity: >=1.030   (Normal Range: 1.003-1.035) Blood: negative   (Normal Range: Negative) pH: 5.0   (Normal Range: 5.0-8.0) Protein: negative   (Normal Range: Negative) Urobilinogen: 0.2   (Normal Range: 0-1) Nitrite: negative   (Normal Range: Negative) Leukocyte Esterace: negative   (Normal Range: Negative)    Comments: Sid Falcon LPN  December 18, 2009 4:12 PM

## 2010-03-31 NOTE — Assessment & Plan Note (Signed)
Summary: swollen lymph nodes under lft armpit/tender to touch/cjr   Vital Signs:  Patient profile:   50 year old female Menstrual status:  regular Temp:     99.0 degrees F oral BP sitting:   140 / 90  (left arm) Cuff size:   regular  Vitals Entered By: Sid Falcon LPN (October 01, 2009 4:08 PM) CC: Swollen glands   History of Present Illness: Patient presents with multiple symptoms which she's had for many months. She has history of silicone breast implants 1997 and she is fairly certain that her multi-symptoms are related to this. She's had ongoing symptoms of fatigue, arthralgias, paresthesias and more recently some ? lymphadenopathy and would like to have further testing. She's had MRI of the brain recently unremarkable. Rheumatoid and other inflammatory markers were negative recently.  Has had evaluation by [facility] in past for paresthesias with no clear etiology found.  She comes in today specifically requesting heavy metal toxin assay and lymphocyte chemical sensitivity test for silicon. She also requests referral to [facility] to a specialist who deals with breast issues.  Her desire is to get silicone implants removed and from her research needs above tests to rule out other potential explanation for her symptoms.  Complaining today of new symptom of bil axillary adenopathy and tenderness.  Allergies: 1)  ! Ferrous Sulfate (Ferrous Sulfate)  Past History:  Past Medical History: Last updated: 06/02/2009 Asthma Depression GERD Urinary incontinence (stress)  Past Surgical History: Last updated: 06/02/2009 Cholecystectomy  1991 Tonsillectomy  1978 Breast Bx  2011 Appendectomy  1987 Breast Implants 1997 Anal Fissure 1985 Tummy Tuck 1997  Family History: Last updated: 06/02/2009 CAD mother 81 CVA  both parents ? age Type 2 diabetes both parents HTN   both parents  Social History: Last updated: 06/02/2009 Married Current  Smoker Alcohol use-yes  Risk Factors: Smoking Status: current (06/02/2009) PMH-FH-SH reviewed for relevance  Review of Systems       The patient complains of enlarged lymph nodes.  The patient denies anorexia, fever, weight loss, vision loss, chest pain, syncope, dyspnea on exertion, peripheral edema, hemoptysis, abdominal pain, melena, hematochezia, severe indigestion/heartburn, and hematuria.    Physical Exam  General:  Well-developed,well-nourished,in no acute distress; alert,appropriate and cooperative throughout examination Head:  Normocephalic and atraumatic without obvious abnormalities. No apparent alopecia or balding. Eyes:  pupils equal, pupils round, and pupils reactive to light.   Mouth:  Oral mucosa and oropharynx without lesions or exudates.  Teeth in good repair. Neck:  No deformities, masses, or tenderness noted. Lungs:  Normal respiratory effort, chest expands symmetrically. Lungs are clear to auscultation, no crackles or wheezes. Heart:  Normal rate and regular rhythm. S1 and S2 normal without gallop, murmur, click, rub or other extra sounds. Extremities:  no edema. Neurologic:  alert & oriented X3, cranial nerves II-XII intact, strength normal in all extremities, and gait normal.   Skin:  no rashes and no suspicious lesions.   Cervical Nodes:  No lymphadenopathy noted Axillary Nodes:  NO adenopathy on exam today. Psych:  not depressed appearing and slightly anxious.     Impression & Recommendations:  Problem # 1:  PARESTHESIA (ICD-782.0) recent MRI normal .  Metabolic labs normal.  Problem # 2:  FATIGUE (ICD-780.79)  Problem # 3:  ARTHRALGIA (ICD-719.40) Recent inflammatory markers unremarkable.  Problem # 4:  BREAST IMPLANTS, BILATERAL, HX OF (ICD-V43.82) pt requesting heavy metal toxin assay and "lymphocyte chemical sensitivity test for silicone".  She  states these can be obtained through [facility].  We have made referral to [facility] per her request regarding breast issues.  She is specifically is concerned about her risk for anaplastic large cell lymphoma though I explained I am not aware of any DEFINITIVE risks with this.  Complete Medication List: 1)  Naproxen 500 Mg Tabs (Naproxen) .... One by mouth two times a day with food as needed pain  Other Orders: Surgical Referral (Surgery)

## 2010-03-31 NOTE — Progress Notes (Signed)
Summary: Hands, fingers "locked in AM", rheumatology referral?  Phone Note Call from Patient   Caller: Patient Call For: Evelena Peat MD Summary of Call: Pt reports she is taking the Naproxen 500 two times a day, however she is still very stiff in the mornings, her fingers feel "locked".  Rt elbow is still painful.  Pt requesting referral to Rheumatologist is Dr Caryl Never agrees that is an appropriate next step. Initial call taken by: Sid Falcon LPN,  September 04, 5407 9:14 AM  Follow-up for Phone Call        OK with referral but first step would be to have her follow up here and get some screening lab tests first.  Additional Follow-up for Phone Call Additional follow up Details #1::        Appt scheduled with Dr Caryl Never 7/20 Additional Follow-up by: Sid Falcon LPN,  September 03, 8117 4:03 PM

## 2010-05-25 LAB — CBC
HCT: 43.1 % (ref 36.0–46.0)
Hemoglobin: 14.8 g/dL (ref 12.0–15.0)
MCHC: 34.5 g/dL (ref 30.0–36.0)
MCV: 90.1 fL (ref 78.0–100.0)
Platelets: 335 10*3/uL (ref 150–400)
RBC: 4.78 MIL/uL (ref 3.87–5.11)
RDW: 12 % (ref 11.5–15.5)
WBC: 12.1 10*3/uL — ABNORMAL HIGH (ref 4.0–10.5)

## 2010-05-25 LAB — BASIC METABOLIC PANEL
BUN: 11 mg/dL (ref 6–23)
CO2: 29 mEq/L (ref 19–32)
Calcium: 9.7 mg/dL (ref 8.4–10.5)
Chloride: 105 mEq/L (ref 96–112)
Creatinine, Ser: 0.7 mg/dL (ref 0.4–1.2)
GFR calc Af Amer: >60 mL/min (ref >60)
GFR calc non Af Amer: >60 mL/min (ref >60)
Glucose, Bld: 86 mg/dL (ref 70–99)
Potassium: 4 mEq/L (ref 3.5–5.1)
Sodium: 143 mEq/L (ref 135–145)

## 2010-05-25 LAB — PREGNANCY, URINE: Preg Test, Ur: NEGATIVE

## 2010-05-25 LAB — URINALYSIS, ROUTINE W REFLEX MICROSCOPIC
Bilirubin Urine: NEGATIVE
Glucose, UA: NEGATIVE mg/dL
Hgb urine dipstick: NEGATIVE
Ketones, ur: NEGATIVE mg/dL
Nitrite: NEGATIVE
Protein, ur: NEGATIVE mg/dL
Specific Gravity, Urine: 1.002 — ABNORMAL LOW (ref 1.005–1.030)
Urobilinogen, UA: 0.2 mg/dL (ref 0.0–1.0)
pH: 6.5 (ref 5.0–8.0)

## 2010-05-25 LAB — DIFFERENTIAL
Basophils Absolute: 0.2 10*3/uL — ABNORMAL HIGH (ref 0.0–0.1)
Basophils Relative: 1 % (ref 0–1)
Eosinophils Absolute: 0 10*3/uL (ref 0.0–0.7)
Eosinophils Relative: 0 % (ref 0–5)
Lymphocytes Relative: 16 % (ref 12–46)
Lymphs Abs: 1.9 10*3/uL (ref 0.7–4.0)
Monocytes Absolute: 1 10*3/uL (ref 0.1–1.0)
Monocytes Relative: 8 % (ref 3–12)
Neutro Abs: 9 10*3/uL — ABNORMAL HIGH (ref 1.7–7.7)
Neutrophils Relative %: 74 % (ref 43–77)

## 2010-05-25 LAB — PROTIME-INR
INR: 0.97 (ref 0.00–1.49)
Prothrombin Time: 12.8 seconds (ref 11.6–15.2)

## 2010-06-15 ENCOUNTER — Encounter: Payer: Self-pay | Admitting: Family Medicine

## 2010-07-31 HISTORY — PX: BREAST IMPLANT REMOVAL: SHX5361

## 2010-09-12 ENCOUNTER — Emergency Department (HOSPITAL_COMMUNITY): Payer: PRIVATE HEALTH INSURANCE

## 2010-09-12 ENCOUNTER — Emergency Department (HOSPITAL_COMMUNITY)
Admission: EM | Admit: 2010-09-12 | Discharge: 2010-09-13 | Disposition: A | Discharge status: Home / Self Care | Payer: PRIVATE HEALTH INSURANCE | Attending: Emergency Medicine | Admitting: Emergency Medicine

## 2010-09-12 ENCOUNTER — Encounter (HOSPITAL_COMMUNITY): Payer: Self-pay | Admitting: Radiology

## 2010-09-12 DIAGNOSIS — Z9889 Other specified postprocedural states: Secondary | ICD-10-CM | POA: Insufficient documentation

## 2010-09-12 DIAGNOSIS — R0789 Other chest pain: Secondary | ICD-10-CM | POA: Insufficient documentation

## 2010-09-12 DIAGNOSIS — J45909 Unspecified asthma, uncomplicated: Secondary | ICD-10-CM | POA: Insufficient documentation

## 2010-09-12 DIAGNOSIS — I10 Essential (primary) hypertension: Secondary | ICD-10-CM | POA: Insufficient documentation

## 2010-09-12 DIAGNOSIS — R509 Fever, unspecified: Secondary | ICD-10-CM | POA: Insufficient documentation

## 2010-09-12 HISTORY — DX: Essential (primary) hypertension: I10

## 2010-09-12 LAB — DIFFERENTIAL
Basophils Absolute: 0.1 10*3/uL (ref 0.0–0.1)
Basophils Relative: 1 % (ref 0–1)
Eosinophils Absolute: 0.3 10*3/uL (ref 0.0–0.7)
Eosinophils Relative: 3 % (ref 0–5)
Lymphocytes Relative: 32 % (ref 12–46)
Lymphs Abs: 3.5 10*3/uL (ref 0.7–4.0)
Monocytes Absolute: 0.8 10*3/uL (ref 0.1–1.0)
Monocytes Relative: 8 % (ref 3–12)
Neutro Abs: 6.1 10*3/uL (ref 1.7–7.7)
Neutrophils Relative %: 57 % (ref 43–77)

## 2010-09-12 LAB — BASIC METABOLIC PANEL
BUN: 14 mg/dL (ref 6–23)
CO2: 24 mEq/L (ref 19–32)
Calcium: 9.2 mg/dL (ref 8.4–10.5)
Chloride: 105 mEq/L (ref 96–112)
Creatinine, Ser: 0.83 mg/dL (ref 0.50–1.10)
GFR calc Af Amer: >60 mL/min (ref >60)
GFR calc non Af Amer: >60 mL/min (ref >60)
Glucose, Bld: 109 mg/dL — ABNORMAL HIGH (ref 70–99)
Potassium: 4 mEq/L (ref 3.5–5.1)
Sodium: 140 mEq/L (ref 135–145)

## 2010-09-12 LAB — CBC
HCT: 36.8 % (ref 36.0–46.0)
Hemoglobin: 13.5 g/dL (ref 12.0–15.0)
MCH: 31.8 pg (ref 26.0–34.0)
MCHC: 36.7 g/dL — ABNORMAL HIGH (ref 30.0–36.0)
MCV: 86.8 fL (ref 78.0–100.0)
Platelets: 365 10*3/uL (ref 150–400)
RBC: 4.24 MIL/uL (ref 3.87–5.11)
RDW: 12.8 % (ref 11.5–15.5)
WBC: 10.9 10*3/uL — ABNORMAL HIGH (ref 4.0–10.5)

## 2010-09-12 MED ORDER — IOHEXOL 300 MG/ML  SOLN
80.0000 mL | Freq: Once | INTRAMUSCULAR | Status: AC | PRN
  Administered 2010-09-12: 80 mL via INTRAVENOUS

## 2010-10-01 ENCOUNTER — Encounter: Payer: Self-pay | Admitting: Family Medicine

## 2010-10-01 ENCOUNTER — Ambulatory Visit (INDEPENDENT_AMBULATORY_CARE_PROVIDER_SITE_OTHER): Payer: PRIVATE HEALTH INSURANCE | Admitting: Family Medicine

## 2010-10-01 VITALS — BP 120/78 | HR 108 | Temp 98.7°F | Wt 165.0 lb

## 2010-10-01 DIAGNOSIS — R197 Diarrhea, unspecified: Secondary | ICD-10-CM

## 2010-10-01 DIAGNOSIS — R109 Unspecified abdominal pain: Secondary | ICD-10-CM

## 2010-10-01 MED ORDER — METRONIDAZOLE 500 MG PO TABS: 500.0000 mg | ORAL_TABLET | Freq: Three times a day (TID) | ORAL | Status: DC

## 2010-10-01 NOTE — Patient Instructions (Signed)
Drink plenty of fluids. Touch base in one week if diarrhea not resolving.

## 2010-10-01 NOTE — Progress Notes (Signed)
  Subjective:    Patient ID: Rose Robertson, female    DOB: [birth year], 50 y.o.   MRN: [id number]  HPI Patient seen with abdominal pain, abdominal cramps and diarrhea for the past 6 days. Her recent history is that she had removal of breast implants in [place] several weeks ago. She had postoperative complications of chest wall infection. Recent CT chest July 14 revealed probable seroma chest wall but no evidence for abscess. She was placed on multiple antibiotics including over one week history of Levaquin and subsequently switched to Cleocin for 2 weeks. She developed diarrhea and abdominal cramps symptoms 6 days ago. No bloody stools. Abdominal cramps are diffuse. No fever or chills. Decreased appetite. Still drinking fluids and no nausea or vomiting. No orthostatic symptoms.  She is apparently taking antifungal medications including Lamisil. She states recent liver functions were checked in [place] and normal   Review of Systems  Constitutional: Positive for fatigue. Negative for fever and chills.  HENT: Negative for trouble swallowing.   Respiratory: Negative for cough and shortness of breath.   Cardiovascular: Negative for chest pain.  Gastrointestinal: Positive for abdominal pain and diarrhea. Negative for nausea, vomiting, constipation and rectal pain.  Genitourinary: Negative for dysuria.  Neurological: Positive for weakness.       Objective:   Physical Exam  Constitutional: She is oriented to person, place, and time. She appears well-developed and well-nourished. No distress.  HENT:  Mouth/Throat: Oropharynx is clear and moist.  Cardiovascular: Normal rate and regular rhythm.   Pulmonary/Chest: Effort normal and breath sounds normal. No respiratory distress. She has no wheezes. She has no rales.  Abdominal: Soft. Bowel sounds are normal. She exhibits no distension and no mass. There is no rebound and no guarding.        generalized very mild tenderness to  palpation but no localizing tenderness. No guarding or rebound  Musculoskeletal: She exhibits no edema.  Neurological: She is alert and oriented to person, place, and time.  Skin: No rash noted.          Assessment & Plan:  Patient presents with diffuse abdominal cramps and 6 days of diarrhea.  Very high likelihood of C. difficile colitis with multiple recent antibiotics including recent Clindamycin. She is not dehydrated. Check basic metabolic panel, CBC, and C. difficile. Start metronidazole 500 mg 3 times a day pending test results. Follow up immediately for increased abdominal pain, fever or worsening diarrhea

## 2010-10-02 LAB — BASIC METABOLIC PANEL
BUN: 14 mg/dL (ref 6–23)
CO2: 25 mEq/L (ref 19–32)
Calcium: 9.4 mg/dL (ref 8.4–10.5)
Chloride: 106 mEq/L (ref 96–112)
Creatinine, Ser: 0.7 mg/dL (ref 0.4–1.2)
GFR: 88.1 mL/min (ref >60.00)
Glucose, Bld: 90 mg/dL (ref 70–99)
Potassium: 4.2 mEq/L (ref 3.5–5.1)
Sodium: 142 mEq/L (ref 135–145)

## 2010-10-02 LAB — CBC WITH DIFFERENTIAL/PLATELET
Basophils Absolute: 0.1 10*3/uL (ref 0.0–0.1)
Basophils Relative: 0.7 % (ref 0.0–3.0)
Eosinophils Absolute: 0.9 10*3/uL — ABNORMAL HIGH (ref 0.0–0.7)
Eosinophils Relative: 6.5 % — ABNORMAL HIGH (ref 0.0–5.0)
HCT: 39 % (ref 36.0–46.0)
Hemoglobin: 13.2 g/dL (ref 12.0–15.0)
Lymphocytes Relative: 18.3 % (ref 12.0–46.0)
Lymphs Abs: 2.5 10*3/uL (ref 0.7–4.0)
MCHC: 33.9 g/dL (ref 30.0–36.0)
MCV: 91.9 fl (ref 78.0–100.0)
Monocytes Absolute: 1 10*3/uL (ref 0.1–1.0)
Monocytes Relative: 7.3 % (ref 3.0–12.0)
Neutro Abs: 9.1 10*3/uL — ABNORMAL HIGH (ref 1.4–7.7)
Neutrophils Relative %: 67.2 % (ref 43.0–77.0)
Platelets: 329 10*3/uL (ref 150.0–400.0)
RBC: 4.24 Mil/uL (ref 3.87–5.11)
RDW: 12.9 % (ref 11.5–14.6)
WBC: 13.6 10*3/uL — ABNORMAL HIGH (ref 4.5–10.5)

## 2010-10-07 ENCOUNTER — Telehealth: Payer: Self-pay | Admitting: Family Medicine

## 2010-10-07 NOTE — Telephone Encounter (Signed)
Please advise 

## 2010-10-07 NOTE — Telephone Encounter (Signed)
Pt called req results from stool sample. Pls call asap today.

## 2010-10-07 NOTE — Progress Notes (Signed)
Quick Note:  Pt informed ______ 

## 2010-10-08 ENCOUNTER — Telehealth: Payer: Self-pay | Admitting: *Deleted

## 2010-10-08 LAB — CLOSTRIDIUM DIFFICILE EIA: CDIFTX: NEGATIVE

## 2010-10-08 NOTE — Progress Notes (Signed)
Quick Note:  Pt informed, still having diarrhea, is taking all meds as recommended. Please schedule for tomorrow (friday) at 11:45 am. ______

## 2010-10-08 NOTE — Telephone Encounter (Signed)
See lab result note.

## 2010-10-08 NOTE — Progress Notes (Signed)
Ov has been sch for pt at 11:45am on Friday 10/09/10, as noted.

## 2010-10-08 NOTE — Telephone Encounter (Signed)
VM from pt requesting labs and stool studies be faxed to Dr Domenica Fail in [place], [phone], done electronically

## 2010-10-09 ENCOUNTER — Encounter: Payer: Self-pay | Admitting: Family Medicine

## 2010-10-09 ENCOUNTER — Ambulatory Visit (INDEPENDENT_AMBULATORY_CARE_PROVIDER_SITE_OTHER): Payer: PRIVATE HEALTH INSURANCE | Admitting: Family Medicine

## 2010-10-09 DIAGNOSIS — Z0000 Encounter for general adult medical examination without abnormal findings: Secondary | ICD-10-CM

## 2010-10-09 DIAGNOSIS — Z23 Encounter for immunization: Secondary | ICD-10-CM

## 2010-10-09 DIAGNOSIS — R197 Diarrhea, unspecified: Secondary | ICD-10-CM

## 2010-10-09 MED ORDER — DICYCLOMINE HCL 10 MG PO CAPS: 10.0000 mg | ORAL_CAPSULE | Freq: Three times a day (TID) | ORAL | Status: DC

## 2010-10-09 MED ORDER — METRONIDAZOLE 500 MG PO TABS: 500.0000 mg | ORAL_TABLET | Freq: Three times a day (TID) | ORAL | Status: AC

## 2010-10-09 MED ORDER — TETANUS-DIPHTH-ACELL PERTUSSIS 5-2.5-18.5 LF-MCG/0.5 IM SUSP: 0.5000 mL | Freq: Once | INTRAMUSCULAR | Status: DC

## 2010-10-09 NOTE — Progress Notes (Signed)
  Subjective:    Patient ID: Rose Robertson, female    DOB: [birth year], 50 y.o.   MRN: [id number]  HPI Followup malaise and profuse diarrhea. Refer to prior note. Patient had recent breast surgery with removal of implants and had subsequent chest wall infection treated with clindamycin for several weeks. Developed severe diarrhea with up to 15 stools per day. We suspected C. difficile. She was placed on metronidazole 500 mg 3 times a day and is not back to baseline but is feeling some better. She is having about 6 watery stools per day. No bloody stools. No fever. Frequent abdominal cramping. Still has some malaise. CBC revealed mild elevated white count. Electrolytes normal. C. difficile screen surprisingly negative.   Review of Systems  Constitutional: Positive for fatigue. Negative for fever and chills.  Gastrointestinal: Positive for abdominal pain and diarrhea. Negative for nausea, vomiting, blood in stool and abdominal distention.  Genitourinary: Negative for dysuria.  Neurological: Negative for dizziness.       Objective:   Physical Exam  Constitutional: She appears well-developed and well-nourished. No distress.  HENT:  Mouth/Throat: Oropharynx is clear and moist.  Cardiovascular: Normal rate and regular rhythm.   Pulmonary/Chest: Effort normal and breath sounds normal. No respiratory distress. She has no wheezes. She has no rales.  Abdominal: Soft. Bowel sounds are normal. She exhibits no distension. There is no tenderness. There is no rebound and no guarding.          Assessment & Plan:  Diarrhea. Very likely related to recent antibiotics and clinically suspect possible C. difficile. She has improved some clinically on metronidazole. We have written for another 10 day course of metronidazole.   Bentyl 10 mg one every 6 hours to use only as needed for severe cramps. Followup promptly for any fever or worsening symptoms. She will touch base by next week if not fully  resolving

## 2010-10-09 NOTE — Patient Instructions (Signed)
Continue plenty of fluids and plenty of potassium containing foods. Be in touch if diarrhea not further improved by next week.

## 2010-10-12 ENCOUNTER — Ambulatory Visit: Payer: PRIVATE HEALTH INSURANCE | Admitting: Family Medicine

## 2010-10-27 ENCOUNTER — Encounter: Payer: Self-pay | Admitting: Family Medicine

## 2010-10-27 ENCOUNTER — Ambulatory Visit (INDEPENDENT_AMBULATORY_CARE_PROVIDER_SITE_OTHER): Payer: PRIVATE HEALTH INSURANCE | Admitting: Family Medicine

## 2010-10-27 VITALS — BP 110/76 | Temp 98.3°F | Wt 162.0 lb

## 2010-10-27 DIAGNOSIS — R197 Diarrhea, unspecified: Secondary | ICD-10-CM

## 2010-10-27 NOTE — Patient Instructions (Signed)
Hold Protonix for now. We will call you regarding GI appointment.

## 2010-10-27 NOTE — Progress Notes (Signed)
  Subjective:    Patient ID: Rose Robertson, female    DOB: 01-04-1961, 50 y.o.   MRN: [id number]  HPI Patient seen with some persistent diarrhea now over one month duration. Refer to prior note (10-01-10). She had prolonged course with clindamycin and clinically we suspected C. difficile. C. difficile screen was negative. We treated with metronidazole- actually 2 courses and she did go from having over 10 watery stools a day to about 4 currently. Denies fever or any bloody stools. Still has diffuse abdominal cramps like pains. No nausea or vomiting. Poor appetite.  Increased fatigue.  Finished second course of metronidazole about one week ago.  She is 50 and had remote reported colonoscopy back in the 1980s but none in recent years.   Review of Systems  Constitutional: Positive for chills and fatigue. Negative for fever and activity change.  Respiratory: Negative for cough and shortness of breath.   Cardiovascular: Negative for chest pain.  Gastrointestinal: Positive for abdominal pain and diarrhea. Negative for nausea, vomiting, constipation and blood in stool.  Neurological: Negative for dizziness.       Objective:   Physical Exam  Constitutional: She is oriented to person, place, and time. She appears well-developed and well-nourished. No distress.  HENT:  Mouth/Throat: Oropharynx is clear and moist.  Neck: Neck supple. No thyromegaly present.  Cardiovascular: Normal rate and regular rhythm.   Pulmonary/Chest: Effort normal and breath sounds normal. No respiratory distress. She has no wheezes. She has no rales.  Abdominal: Soft. Bowel sounds are normal. She exhibits no distension and no mass. There is no tenderness. There is no rebound and no guarding.  Musculoskeletal: She exhibits no edema.  Neurological: She is alert and oriented to person, place, and time.          Assessment & Plan:  Persistent diarrhea in patient on prolonged course of clindamycin with negative C.  difficile screen. She is moderately improved following metronidazole but not resolved. We discussed possible treatment with oral vancomycin but since she did not have positive screen recommend GI referral for further evaluation. She does not appear clinically dehydrated at this time. No clear benefit of Protonix we have recommend holding at this time.

## 2010-10-28 ENCOUNTER — Encounter: Payer: Self-pay | Admitting: Gastroenterology

## 2010-11-06 ENCOUNTER — Telehealth: Payer: Self-pay | Admitting: *Deleted

## 2010-11-06 ENCOUNTER — Telehealth: Payer: Self-pay | Admitting: Gastroenterology

## 2010-11-06 ENCOUNTER — Ambulatory Visit (INDEPENDENT_AMBULATORY_CARE_PROVIDER_SITE_OTHER): Payer: PRIVATE HEALTH INSURANCE | Admitting: Gastroenterology

## 2010-11-06 ENCOUNTER — Encounter: Payer: Self-pay | Admitting: Gastroenterology

## 2010-11-06 VITALS — BP 112/72 | HR 90 | Ht 60.0 in | Wt 160.0 lb

## 2010-11-06 DIAGNOSIS — IMO0001 Reserved for inherently not codable concepts without codable children: Secondary | ICD-10-CM

## 2010-11-06 DIAGNOSIS — A0472 Enterocolitis due to Clostridium difficile, not specified as recurrent: Secondary | ICD-10-CM

## 2010-11-06 DIAGNOSIS — E739 Lactose intolerance, unspecified: Secondary | ICD-10-CM

## 2010-11-06 DIAGNOSIS — M797 Fibromyalgia: Secondary | ICD-10-CM

## 2010-11-06 DIAGNOSIS — R197 Diarrhea, unspecified: Secondary | ICD-10-CM

## 2010-11-06 DIAGNOSIS — A09 Infectious gastroenteritis and colitis, unspecified: Secondary | ICD-10-CM | POA: Insufficient documentation

## 2010-11-06 DIAGNOSIS — K219 Gastro-esophageal reflux disease without esophagitis: Secondary | ICD-10-CM

## 2010-11-06 DIAGNOSIS — Z9882 Breast implant status: Secondary | ICD-10-CM | POA: Insufficient documentation

## 2010-11-06 DIAGNOSIS — Z978 Presence of other specified devices: Secondary | ICD-10-CM

## 2010-11-06 HISTORY — DX: Lactose intolerance, unspecified: E73.9

## 2010-11-06 HISTORY — DX: Fibromyalgia: M79.7

## 2010-11-06 MED ORDER — HYOSCYAMINE SULFATE 0.125 MG SL SUBL: 0.1250 mg | SUBLINGUAL_TABLET | Freq: Three times a day (TID) | SUBLINGUAL | Status: DC

## 2010-11-06 MED ORDER — HYDROCORTISONE ACE-PRAMOXINE 1-1 % RE CREA: TOPICAL_CREAM | Freq: Two times a day (BID) | RECTAL | Status: AC

## 2010-11-06 MED ORDER — VANCOMYCIN HCL 250 MG PO CAPS: ORAL_CAPSULE | ORAL | Status: DC

## 2010-11-06 NOTE — Patient Instructions (Signed)
Take vancomycin one tablet by mouth three times a day for 2 weeks, then one tablet by mouth twice a day for 2 weeks, then once a day for 2 weeks, we have we have printed and gave you your rx. Use Analpram as needed per rectum. Come back to the basement in the [facility] and have a repeat C diff on 12/18/2010. Make an appt to come back and see Dr Jarold Motto 10/25/ or 10/26, so we will have the repeat c-diff culture back.  Follow the sheet of artifical sweeteners to avoid. Follow the low fiber diet below.    Low-Fiber and Residue Restricted Diets A low-fiber diet restricts foods that contain carbohydrates that are not digested in the small intestine. A diet containing about 10 grams of fiber is considered low-fiber. The diet needs to be individualized to suit patient tolerances and preferences and to avoid unnecessary restrictions. Generally, the foods emphasized in a low-fiber diet have no skins or seeds. They may have been processed to remove bran, germ, or husks. Cooking may not necessarily eliminate the fiber. Cooking may, in fact, enable a greater quantity of fiber to be consumed in a lesser volume. Legumes and nuts are also restricted. The term low-residue has also been used to describe low-fiber diets, although the two are not the same. Residue refers to any substance that adds to bowel (colonic) contents, such as sloughed cells and intestinal germs (bacteria) in addition to fiber. Residue-containing foods, prunes and prune juice, milk, and connective tissue from meats may also need to be eliminated. It is important to eliminate these foods during sudden (acute) attacks of inflammatory bowel disease, when there is a partial obstruction due to another reason, or when minimal fecal output is desired. When problems are in remission, a more normal diet may be used. PURPOSE  Prevent blockage of a partially obstructed or narrowed gastrointestinal tract.   Reduce stool weight and volume.   Slow  the movement of waste.  WHEN IS THIS DIET USED?  Acute phase of Crohn's disease, ulcerative colitis, regional enteritis, or diverticulitis.   Narrowing (stenosis) of intestinal or esophageal tubes (lumina).   Transitional diet following surgery, injury (trauma), or illness.  ADEQUACY This diet is nutritionally adequate based on individual food choices according to the Recommended Dietary Allowances of the [employer]. SPECIAL NOTES In severe cases, it is recommended that residue containing foods, prunes and prune juice, milk, and connective tissue from meats be eliminated. Check labels, especially on foods from the starch list. Often, dietary fiber content is listed with the nutrition information. Since products are continually introduced or removed from the grocery shelf, this list may not be complete. FOOD GROUP ALLOWED/RECOMMENDED AVOID/USE SPARINGLY  STARCHES 6 servings or more daily    Breads White, [place], and pita breads, plain rolls, buns, or sweet rolls, doughnuts, waffles, pancakes, bagels. Plain muffins, sweet breads, biscuits, matzoth. Flour. Bread, rolls, or crackers made with whole-wheat, multigrains, rye, bran seeds, nuts, or coconut. Corn tortillas, table-shells.  Crackers Soda, saltine, or graham crackers. Pretzels, rusks, melba toast, zwieback. See above. Corn chips, tortilla chips.  Cereals Cooked cereals: cornmeal, farina, cream cereals. Dry cereals: refined corn, wheat, rice, and oat cereals (check label). Cereals containing whole-grains, multigrains, bran, coconut, nuts, or raisins. Cooked or dry oatmeal. Coarse wheat cereals, granola. Cereals advertised as "high fiber."  Potatoes/Pasta/Rice Potatoes prepared any way without skins, refined macaroni, spaghetti, noodles, refined rice. Potato skins. Whole-grain pasta, wild or brown rice. Popcorn.  VEGETABLES 2 to 3  servings or more daily Strained tomato and vegetable juices. Fresh: tender lettuce, cucumber,  cabbage, spinach, bean sprouts. Cooked, canned: asparagus, bean sprouts, cut green or wax beans, cauliflower, pumpkin, beets, mushrooms, olives, spinach, yellow squash, tomato, tomato sauce (no seeds), zucchini (peeled), turnips. Canned sweet potatoes. Small amounts of celery, onion, radish, and green pepper may be used. Keep servings limited to 1/2 cup. Fresh, cooked, or canned: artichokes, baked beans, beet greens, broccoli, Brussels sprouts, French-style green beans, corn, kale, legumes, peas, sweet potatoes. Cooked: green or red cabbage, spinach. Avoid large servings of any vegetables.  FRUIT 2 to 3 servings or more daily All fruit juices except prune juice. Cooked or canned: apricots applesauce, cantaloupe, cherries, grapefruit, grapes, kiwi, mandarin oranges, peaches, pears, fruit cocktail, pineapple, plums, watermelon. Fresh: banana, grapes, cantaloupe, avocado, cherries, pineapple, grapefruit, kiwi, nectarines, peaches, oranges, blueberries, plums. Keep servings limited to 1/2 cup or 1 piece. Fresh: apple with or without skin, apricots, mango, pears, raspberries, strawberries. Prune juice, stewed or dried prunes. Dried fruits, raisins, dates. Avoid large servings of all fresh fruits.  MEAT AND MEAT SUBSTITUTES 2 servings or more (4 to 6 total daily) Ground or well-cooked tender beef, ham, veal, lamb, pork, or poultry. Eggs, plain cheese. Fish, oysters, shrimp, lobster, other seafood. Liver, organ meats. Tough, fibrous meats with gristle. Peanut butter, smooth or chunky. Cheese with seeds, nuts, or other foods not allowed. Nuts, seeds, legumes, dried peas, beans, lentils.  MILK 2 cups or equivalent daily All milk products except those not allowed. Milk and milk product consumption should be minimal when low residue is desired. Yogurt that contains nuts or seeds.  SOUPS AND COMBINATION FOODS Bouillon, broth, or cream soups made from allowed foods. Any strained soup. Casseroles or mixed dishes made with  allowed foods. Soups made from vegetables that are not allowed or that contain other foods not allowed.  DESSERTS AND SWEETS In moderation Plain cakes and cookies, pie made with allowed fruit, pudding, custard, cream pie. Gelatin, fruit, ice, sherbet, frozen ice pops. Ice cream, ice milk without nuts. Plain hard candy, honey, jelly, molasses, syrup, sugar, chocolate syrup, gumdrops, marshmallows. Desserts, cookies, or candies that contain nuts, peanut butter, or dried fruits. Jams, preserves with seeds, marmalade.  FATS AND OILS In moderation Margarine, butter, cream, mayonnaise, salad oils, plain salad dressings made from allowed foods. Plain gravy, crisp bacon without rind. Seeds, nuts, olives. Avocados.  BEVERAGES All, except those listed to avoid. Fruit juices with high pulp, prune juice.  CONDIMENTS/ MISCELLANEOUS Ketchup, mustard, horseradish, vinegar, cream sauce, cheese sauce, cocoa powder. Spices in moderation: allspice, basil, bay leaves, celery powder or leaves, cinnamon, cumin powder, curry powder, ginger, mace, marjoram, onion or garlic powder, oregano, paprika, parsley flakes, ground pepper, rosemary, sage, savory, tarragon, thyme, turmeric. Coconut, pickles.  A serving is equal to: 1/2 cup for fruits, vegetables, and cooked cereals or 1 piece for foods such as a piece of bread, 1 orange, or 1 apple. For dry cereals and crackers, use serving sizes listed on the label. SAMPLE MEAL PLAN The following menu is provided as a sample. Your daily menu plans will vary. Be sure to include a minimum of the following each day in order to provide essential nutrients for the adult: Starch/Bread/Cereal Group Fruit/Vegetable Group Meat/Meat Substitute Group Milk/Milk Substitute Group 6 servings 5 servings 2 servings 2 servings  Combination foods may count as full or partial servings from various food groups. Fats, desserts, and sweets may be added to the meal plan after the requirements  for  essential nutrients are met. SAMPLE MENU Breakfast Lunch Supper  1/2 cup orange juice 1/2 cup chicken noodle soup 3 ounces baked chicken  1 boiled egg 2 to 3 ounces sliced roast beef 1/2 cup scalloped potatoes  1 slice white toast 2 slices seedless rye bread 1/2 cup cooked beets  Margarine Mayonnaise White dinner roll  3/4 cup cornflakes 1/2 cup tomato juice Margarine  1 cup milk 1 small banana 1/2 cup canned peaches  Beverage Beverage Beverage  Document Released: 08/07/2001 Document Re-Released: 05/12/2009 [facility] Patient Information 2011 [place], [place].

## 2010-11-06 NOTE — Progress Notes (Signed)
History of Present Illness:  This is a very pleasant 50 year old Caucasian female referred by Dr. Evelena Peat for evaluation of 2 months of dull cramping and watery diarrhea currently responsive to 2 weeks of vancomycin after taking 2 courses of metronidazole for suspected C. difficile infection. She had infected breast implants and was treated with clindamycin before her diarrhea and abdominal pain began. She was having up to 20 bowel movements a day without rectal bleeding, and she denied fever, chills, or nausea and vomiting. Initial C. difficile toxin assay by [facility] was negative. She is currently completing 2 weeks of vancomycin 250 mg 3 times a day per her Engineer, petroleum in [place]. Her bowel movements have decreased from 20-5 a day with diminished abdominal discomfort. Patient has a long history of recurrent anal fissures apparently had surgery in the 1980s. There is a family history of colon polyps in her sister. The patient is status post cholecystectomy, appendectomy, and has a long history of fibromyalgia. She was chewing gum today which was sorbitol based. She currently is on a self-imposed low fiber diet. There is no history of known lactose intolerance or celiac disease.   I have reviewed this patient's present history, medical and surgical past history, allergies and medications.   Past Medical History  Diagnosis Date  . Asthma   . Depression   . GERD (gastroesophageal reflux disease)   . Urinary incontinence, stress   . Hypertension   . Anal fissure   . C. difficile diarrhea   . Fibromyalgia   . Hyperlipidemia   . Pancreatitis   . Pneumonia    Past Surgical History  Procedure Date  . Cholecystectomy 1991  . Tonsillectomy 1978  . Appendectomy 1987  . Breast enhancement surgery 1997  . Anal fissure repair 1985  . Tummy tuck 1997  . Breast biopsy 2011    reports that she has been smoking Cigarettes.  She has a 3.4 pack-year smoking history. She does not have any  smokeless tobacco history on file. She reports that she drinks alcohol. She reports that she does not use illicit drugs. family history includes Colon polyps in her sister; Coronary artery disease (age of onset:45) in her mother; Diabetes in her father and mother; Hypertension in her father and mother; and Stroke in her father and mother. Allergies  Allergen Reactions  . Sulfa Antibiotics     Viviann Spare Johnson's syndrome  . Latex     rash       ROS: The remainder of the 10 point ROS is negative... fairly positive review of system with anxiety, back pain, history of chronic confusion, chronic cough and fatigue, low-grade fever, headaches, periodic cardiac arrhythmias, previous shingles infection, myalgias, night sweats, shortness of breath with exertion, chronic insomnia, excessive thirst excessive urination and occasional urinary incontinence 2. He also has a history of hypertension and hyperlipidemia. She complains of daily GERD, and a double PPI therapy for many years. Her family history is positive for colon polyps in her sister. Her discomfort in her breast is improved with therapy, and she sees her Engineer, petroleum in [place] periodically. She denies dysphagia at this time. She also denies an inflammatory arthropathy, skin rash, or mouth sores. There is no family history of inflammatory bowel disease.   Physical Exam: General well developed well nourished patient in no acute distress, appearing her stated age Eyes PERRLA, no icterus, fundoscopic exam per opthamologist Skin no lesions noted Neck supple, no adenopathy, no thyroid enlargement, no tenderness Chest clear to  percussion and auscultation Heart no significant murmurs, gallops or rubs noted Abdomen no hepatosplenomegaly masses or tenderness, BS normal.  Rectal inspection normal no fissures, or fistulae noted.  No masses or tenderness on digital exam. Stool guaiac negative. Tender rectum without definite seizure or fistula noted. Dual  is formed and wide negative. Extremities no acute joint lesions, edema, phlebitis or evidence of cellulitis. Neurologic patient oriented x 3, cranial nerves intact, no focal neurologic deficits noted. Psychological mental status normal and normal affect.  Assessment and plan: C. difficile colitis beginning to respond to oral vancomycin therapy. This infection is directly in her case linked to clindamycin therapy. We will continue vancomycin 250 mg 3 times a day for 2 weeks, then twice a day for 2 weeks, then once a day for 2 weeks for what appears to be resistant C. difficile infection with SPORE formation. I placed her on low fiber diet with avoidance of sorbitol and fructose. Also have added Florstar Saccharomyces probiotic twice a day for the next 6 weeks. She will see me in 6 weeks with C. difficile toxin assay by PCR before her visit. The patient also is having rather severe GERD since discontinuing Protonix, and I have restarted her protonic 40 mg a day. When she returns she will need to be scheduled for endoscopy and colonoscopy exams.

## 2010-11-06 NOTE — Telephone Encounter (Signed)
Advised pt that her AVS shows a Tdap bc one was given on 10/09/2010 with Dr Caryl Never. She will call them since she does nto rememeber ever getting this injection

## 2010-11-06 NOTE — Telephone Encounter (Signed)
Advised pt she needs to take florastor bid while on vanco.

## 2010-12-04 ENCOUNTER — Ambulatory Visit (INDEPENDENT_AMBULATORY_CARE_PROVIDER_SITE_OTHER): Payer: PRIVATE HEALTH INSURANCE | Admitting: Family Medicine

## 2010-12-04 ENCOUNTER — Encounter: Payer: Self-pay | Admitting: Family Medicine

## 2010-12-04 VITALS — BP 130/88 | Temp 98.7°F | Wt 159.0 lb

## 2010-12-04 DIAGNOSIS — A0472 Enterocolitis due to Clostridium difficile, not specified as recurrent: Secondary | ICD-10-CM

## 2010-12-04 DIAGNOSIS — R209 Unspecified disturbances of skin sensation: Secondary | ICD-10-CM

## 2010-12-04 DIAGNOSIS — R5383 Other fatigue: Secondary | ICD-10-CM

## 2010-12-04 DIAGNOSIS — R208 Other disturbances of skin sensation: Secondary | ICD-10-CM

## 2010-12-04 DIAGNOSIS — R5381 Other malaise: Secondary | ICD-10-CM

## 2010-12-04 NOTE — Patient Instructions (Signed)
We will call you regarding neurology appointment. 

## 2010-12-04 NOTE — Progress Notes (Signed)
Subjective:    Patient ID: Rose Robertson, female    DOB: 10/24/1960, 50 y.o.   MRN: [id number]  HPI Patient is seen with chief complaint of "Bell's Palsy" symptoms. She relates presumed diagnosis of left sided Bell's palsy back in 2008 relates intermittent symptoms (mostly dysesthesias) which she attributes to that episode. She is complaining mostly of some tingling sensation left side of face. She apparently did have some facial weakness back in 2008. She's not had a recent facial weakness but complains of some altered sensation in taste and also occasional numbness mostly posterior tongue left side.  She has not had any difficulty closing left eyelid or obvious facial weakness recently.  Patient has had prior history of bilateral breast implants. She had multiple somatic symptoms for several years and has attributed most of those to her prior implants. She had recent removal of implants by plastic surgeon in [place] and post operative complication of chest wall infection. Treated with multiple antibiotics and subsequently developed C. difficile. Initially treated with metronidazole and then switched to vancomycin which is still taking. Still has some loose stools but overall improving. Followed by gastroenterology. She's had some chronic bilateral tinnitus which preceded vancomycin and no hearing changes.  She continues to complain of several vague dysesthesias including left second toe and left upper extremity. She's had atypical headaches previously. Had MRI scan of the brain May 2011 which showed some right maxillary sinus thickening but no acute finding otherwise.  No demyelinating lesions.  Patient has previously seen neurology in [facility] but is requesting transfer to someone here. She has not had any recent active followup with neurology.  Past Medical History  Diagnosis Date  . Asthma   . Depression   . GERD (gastroesophageal reflux disease)   . Urinary incontinence, stress     . Hypertension   . Anal fissure   . C. difficile diarrhea   . Fibromyalgia   . Hyperlipidemia   . Pancreatitis   . Pneumonia    Past Surgical History  Procedure Date  . Cholecystectomy 1991  . Tonsillectomy 1978  . Appendectomy 1987  . Breast enhancement surgery 1997  . Anal fissure repair 1985  . Tummy tuck 1997  . Breast biopsy 2011    reports that she has been smoking Cigarettes.  She has a 3.4 pack-year smoking history. She does not have any smokeless tobacco history on file. She reports that she drinks alcohol. She reports that she does not use illicit drugs. family history includes Colon polyps in her sister; Coronary artery disease (age of onset:45) in her mother; Diabetes in her father and mother; Hypertension in her father and mother; and Stroke in her father and mother. Allergies  Allergen Reactions  . Sulfa Antibiotics     Viviann Spare Johnson's syndrome  . Latex     rash        Review of Systems  Constitutional: Positive for fatigue. Negative for fever and chills.  HENT: Positive for tinnitus. Negative for hearing loss, congestion and trouble swallowing.   Respiratory: Negative for cough and shortness of breath.   Cardiovascular: Negative for chest pain, palpitations and leg swelling.  Skin: Negative for rash.  Neurological: Positive for weakness. Negative for dizziness, syncope, light-headedness and headaches.  Psychiatric/Behavioral: Negative for dysphoric mood.       Objective:   Physical Exam  Constitutional: She is oriented to person, place, and time. She appears well-developed and well-nourished.  HENT:  Right Ear: External ear normal.  Left Ear:  External ear normal.  Mouth/Throat: Oropharynx is clear and moist.  Eyes: EOM are normal. Pupils are equal, round, and reactive to light.       Fundi benign  Neck: Neck supple. No thyromegaly present.  Cardiovascular: Normal rate, regular rhythm and normal heart sounds.   Pulmonary/Chest: Effort normal and  breath sounds normal. No respiratory distress. She has no wheezes. She has no rales.  Musculoskeletal: She exhibits no edema.  Lymphadenopathy:    She has no cervical adenopathy.  Neurological: She is alert and oriented to person, place, and time. She has normal reflexes. No cranial nerve deficit.       Babinski downgoing bilaterally. Romberg normal. No focal strength deficits. Cerebellar normal by finger to nose testing  Skin: No rash noted.  Psychiatric: She has a normal mood and affect. Her behavior is normal.          Assessment & Plan:  Patient presents with multiple somatic symptoms. She has vague dysesthesias involving extremities and tongue. Reported remote history of Bell's palsy but no evidence for persistent facial nerve weakness. She has multiple symptoms which are difficult to tie together. Requesting neurology referral and we'll proceed.

## 2010-12-07 ENCOUNTER — Encounter: Payer: Self-pay | Admitting: Neurology

## 2010-12-15 ENCOUNTER — Telehealth: Payer: Self-pay | Admitting: *Deleted

## 2010-12-15 ENCOUNTER — Encounter: Payer: Self-pay | Admitting: Neurology

## 2010-12-15 ENCOUNTER — Ambulatory Visit (INDEPENDENT_AMBULATORY_CARE_PROVIDER_SITE_OTHER): Payer: PRIVATE HEALTH INSURANCE | Admitting: Neurology

## 2010-12-15 VITALS — BP 112/78 | HR 72 | Ht 59.0 in | Wt 159.0 lb

## 2010-12-15 DIAGNOSIS — R413 Other amnesia: Secondary | ICD-10-CM

## 2010-12-15 DIAGNOSIS — R209 Unspecified disturbances of skin sensation: Secondary | ICD-10-CM

## 2010-12-15 DIAGNOSIS — A0472 Enterocolitis due to Clostridium difficile, not specified as recurrent: Secondary | ICD-10-CM

## 2010-12-15 DIAGNOSIS — R200 Anesthesia of skin: Secondary | ICD-10-CM

## 2010-12-15 NOTE — Telephone Encounter (Signed)
Pt walked in because she lost her paperwork. We think she meant the repeat CDIFF; ordered repeat CDIFF and pt scheduled for f/u with Dr Jarold Motto.

## 2010-12-15 NOTE — Patient Instructions (Addendum)
Your EEG is scheduled for Thursday, October 25th at 11:00am. Please arrive to [facility] by 10:45am, first floor admitting.  We will call you with the appointment for the memory loss testing to [facility] [address] Rondall Allegra, [place]  [phone].

## 2010-12-15 NOTE — Progress Notes (Signed)
Dear Dr. Caryl Never,  Thank you for having me see Rose Robertson in consultation today at [facility] for her problem with right hemibody sensory change, memory problems, headaches, vertigo and spells of zoning out.  As you may recall, she is a 50 y.o. year old female with a history of multiple somatic complaints, anxiety and depression, possible infection s/p infected breast implants who developed the sudden onset of tingling and numbness of her left hemibody in February 2008.  She first though it was a stroke, but then was told she had "Bell's Palsy".  She said the numbness went away over several months, but then recurred in 2009 and has persisted.  She also complains of frequent headaches, throbbing in nature with phonophobia and nausea that were occuring about 2 times per week.  She only treats these with ibuprofen or naproxen.  These typically last hours and she has to go lie down with them.  Over the last two weeks she has had a persistent headache.  She also complains of zoning out spells as well as spells of loss of consciousness.  She once felt herself slump to the floor in the shower and was "out" for an indeterminate length of time.  She has multiple zoning out spells a week, that last minutes.    She complains of wavy lines that come and go in her vision as well.  These are not related to the headaches.    She believe this is all related to her infection of her breast implants which require a subsequent revision for which she is on chronic anti-microbials(vancomycin, fluconazole).  She has been told that her infection must have gotten in her brain.  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  She endorses panic attacks but not depression.  She is filing for disability because of her symptoms.  Past Medical History  Diagnosis Date  . Asthma   . Depression   . GERD (gastroesophageal reflux disease)   . Urinary incontinence, stress   .  Hypertension   . Anal fissure   . C. difficile diarrhea   . Fibromyalgia   . Hyperlipidemia   . Pancreatitis   . Pneumonia     Past Surgical History  Procedure Date  . Cholecystectomy 1991  . Tonsillectomy 1978  . Appendectomy 1987  . Breast enhancement surgery 1997  . Anal fissure repair 1985  . Tummy tuck 1997  . Breast biopsy 2011    History   Social History  . Marital Status: Married    Spouse Name: N/A    Number of Children: 2  . Years of Education: N/A   Occupational History  . unemployed    Social History Main Topics  . Smoking status: Current Some Day Smoker -- 0.1 packs/day for 34 years    Types: Cigarettes  . Smokeless tobacco: Never Used  . Alcohol Use: Yes     occasionally  . Drug Use: No  . Sexually Active: Not on file   Other Topics Concern  . Not on file   Social History Narrative  . No narrative on file    Family History  Problem Relation Age of Onset  . Coronary artery disease Mother 33  . Hypertension Mother   . Hypertension Father   . Diabetes Mother     type 2  . Diabetes Father     type 2  . Stroke Mother   . Stroke Father   . Colon polyps Sister  Current Outpatient Prescriptions on File Prior to Visit  Medication Sig Dispense Refill  . fluconazole (DIFLUCAN) 200 MG tablet Take 200 mg by mouth daily.        . nebivolol (BYSTOLIC) 5 MG tablet Take 1 and 1/2 tab daily       . pantoprazole (PROTONIX) 40 MG tablet Take 40 mg by mouth daily.        . Probiotic Product (PROBIOTIC PO) Take 1 capsule by mouth daily.        . vancomycin (VANCOCIN) 250 MG capsule Take one capsule by mouth three times a day for 2 weeks, then one capsule by mouth twice a day for 2 weeks, then one capsule by mouth once a day for 2 weeks.  104 capsule  0   Current Facility-Administered Medications on File Prior to Visit  Medication Dose Route Frequency Provider Last Rate Last Dose  . TDaP (BOOSTRIX) injection 0.5 mL  0.5 mL Intramuscular Once Bruce W  Burchette         ROS:  13 systems were reviewed and are notable for feeling of vertigo that is there all the time.  All other review of systems are unremarkable.   Examination:  Filed Vitals:   12/15/10 1519  BP: 112/78  Pulse: 72  Height: 4\' 11"  (1.499 m)  Weight: 159 lb (72.122 kg)     In general, well appearing woman in NAD.  Cardiovascular: The patient has a regular rate and rhythm and no carotid bruits.  Fundoscopy:  Disks are flat. Vessel caliber within normal limits.  Mental status:   The patient is oriented to person, place and time. Recent and remote memory are intact. Attention span and concentration are normal. Language including repetition, naming, following commands are intact. Fund of knowledge of current and historical events, as well as vocabulary are normal.  Cranial Nerves: Pupils are equally round and reactive to light. Visual fields full to confrontation. Extraocular movements are intact without nystagmus. Facial sensation is reduced on the left but does not respect the midline, normalizing about 1 inch off the midline on the right.  Muscles of facial expression are symmetric. Hearing intact to bilateral finger rub. Tongue protrusion, uvula, palate midline.  Shoulder shrug intact.  No synkinesis.  Corneals intact.    Motor:  The patient has normal bulk and tone, no pronator drift.  There are no adventitious movements. 5/5 muscle strength bilaterally. Reflexes:   Biceps  Triceps Brachioradialis Knee Ankle  Right 3+  3+  3+   3+ 3+  Left  3+  3+  3+   3+ 3+  Toes down  Coordination:  Normal finger to nose.  No dysdiadokinesia.  Sensation is decreased to temperature and vibration on her left hemibody.  I did not check the trunk.  Gait and Station are normal.  Tandem gait is intact.  Romberg is negative.  Vestibular testing:  negative head thrust, negative head shaking, + Fukuda walking test with rotation to the right.  MRI Brain reviewed from 5/11.   No obvious focal lesions.  Hippocampi look normal.   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  Her exam, other than the positive Fukuda walking test, does not have any objective findings to endorse dysfunction of her nervous system.  I suspect that some of her complaints are related to her frequent  headaches which are migrainous in nature.  Certainly we do see patients with poorly controlled headaches have "interictal" phenomenon such as visual disturbances, vertigo, memory problems.  It is possible her zoning out spells are seizures -- although I do think this is unlikely.  Much of her complaints are exacerbated by somatization.   Recommendations:  1.  Zoning out spells - I am going to get a routine EEG.  My pre-test probability is low for seizures however. 2.  Headaches - I would probably start this patient on a medication like Cymbalta which can help prevent migraines and may help her "fibromyalgia".   3.  Memory problems - I am uncertain whether she has any significant memory dysfunction.  I am referring her to my neuropsychological colleagues at [facility] for memory testing as well as get their input into whether there are additional factors that may explain the degree of her somatization.  She has a therapist, and ultimately I think much of her treatment should be behavioral.  I am hoping my neuropsychological colleagues may be able to help me in approaching her somatization.   We will see the patient back in 6-8 weeks after her EEG.  I will try to call her after her EEG to see if she wants to start a preventative medication such as Cymbalta for headaches.  Other choices may be Topamax or Effexor.  Thank you for having [place] see Rose Robertson in consultation.  Feel free to contact me  with any questions.  Lupita Raider Modesto Charon, MD [facility], [place] [address] [place], [place] [address] Phone: [phone] Fax: [phone].

## 2010-12-18 ENCOUNTER — Other Ambulatory Visit: Payer: PRIVATE HEALTH INSURANCE

## 2010-12-18 DIAGNOSIS — A0472 Enterocolitis due to Clostridium difficile, not specified as recurrent: Secondary | ICD-10-CM

## 2010-12-21 LAB — CLOSTRIDIUM DIFFICILE BY PCR: Toxigenic C. Difficile by PCR: NOT DETECTED

## 2010-12-24 ENCOUNTER — Ambulatory Visit (HOSPITAL_COMMUNITY)
Admission: RE | Admit: 2010-12-24 | Discharge: 2010-12-24 | Disposition: A | Discharge status: Home / Self Care | Payer: PRIVATE HEALTH INSURANCE | Source: Ambulatory Visit | Attending: Neurology | Admitting: Neurology

## 2010-12-24 DIAGNOSIS — R42 Dizziness and giddiness: Secondary | ICD-10-CM | POA: Insufficient documentation

## 2010-12-24 DIAGNOSIS — R569 Unspecified convulsions: Secondary | ICD-10-CM

## 2010-12-24 DIAGNOSIS — R200 Anesthesia of skin: Secondary | ICD-10-CM

## 2010-12-25 NOTE — Procedures (Signed)
EEG NUMBER:  [id number].  This routine EEG was requested in this 50 year old woman who has a history of spells of getting lightheaded and staring off into space. She is on no anticonvulsant medication.  EEG was done with the patient awake and drowsy.  During periods of maximal wakefulness, she had a 10 cycle per second posterior dominant rhythm that was very well regulated and well sustained and reactive to eye opening, as well as being symmetric.  Background activities are composed low-amplitude beta activities that were frontally dominant and symmetric.  Photic stimulation produced a symmetric driving response.  The patient was not hyperventilated.  The patient did become drowsy as evidenced by attenuation of the muscle activity along with the alpha rhythm and a short burst of slower theta activities.  These were symmetric.  CLINICAL INTERPRETATION:  This routine EEG done with the patient awake and drowsy is normal.          ______________________________ Denton Meek, MD    AV:[id number] D:  12/25/2010 09:50:59  T:  12/25/2010 11:03:14  Job #:  [id number]

## 2010-12-29 ENCOUNTER — Encounter: Payer: Self-pay | Admitting: Gastroenterology

## 2010-12-29 ENCOUNTER — Ambulatory Visit (INDEPENDENT_AMBULATORY_CARE_PROVIDER_SITE_OTHER): Payer: PRIVATE HEALTH INSURANCE | Admitting: Gastroenterology

## 2010-12-29 VITALS — BP 106/76 | HR 68 | Ht 59.0 in | Wt 160.0 lb

## 2010-12-29 DIAGNOSIS — R197 Diarrhea, unspecified: Secondary | ICD-10-CM

## 2010-12-29 DIAGNOSIS — A0472 Enterocolitis due to Clostridium difficile, not specified as recurrent: Secondary | ICD-10-CM | POA: Insufficient documentation

## 2010-12-29 MED ORDER — PEG-KCL-NACL-NASULF-NA ASC-C 100 G PO SOLR: 1.0000 | Freq: Once | ORAL | Status: DC

## 2010-12-29 NOTE — Patient Instructions (Signed)
Your Colonoscopy is scheduled on 12/30/2010 at 9:30am Your MoviPrep has been sent to your pharmacy  Colonoscopy A colonoscopy is an exam to evaluate your entire colon. In this exam, your colon is cleansed. A long fiberoptic tube is inserted through your rectum and into your colon. The fiberoptic scope (endoscope) is a long bundle of enclosed and very flexible fibers. These fibers transmit light to the area examined and send images from that area to your caregiver. Discomfort is usually minimal. You may be given a drug to help you sleep (sedative) during or prior to the procedure. This exam helps to detect lumps (tumors), polyps, inflammation, and areas of bleeding. Your caregiver may also take a small piece of tissue (biopsy) that will be examined under a microscope. LET YOUR CAREGIVER KNOW ABOUT:   Allergies to food or medicine.   Medicines taken, including vitamins, herbs, eyedrops, over-the-counter medicines, and creams.   Use of steroids (by mouth or creams).   Previous problems with anesthetics or numbing medicines.   History of bleeding problems or blood clots.   Previous surgery.   Other health problems, including diabetes and kidney problems.   Possibility of pregnancy, if this applies.  BEFORE THE PROCEDURE   A clear liquid diet may be required for 2 days before the exam.   Ask your caregiver about changing or stopping your regular medications.   Liquid injections (enemas) or laxatives may be required.   A large amount of electrolyte solution may be given to you to drink over a short period of time. This solution is used to clean out your colon.   You should be present 60 minutes prior to your procedure or as directed by your caregiver.  AFTER THE PROCEDURE   If you received a sedative or pain relieving medication, you will need to arrange for someone to drive you home.   Occasionally, there is a little blood passed with the first bowel movement. Do not be concerned.    FINDING OUT THE RESULTS OF YOUR TEST Not all test results are available during your visit. If your test results are not back during the visit, make an appointment with your caregiver to find out the results. Do not assume everything is normal if you have not heard from your caregiver or the medical facility. It is important for you to follow up on all of your test results. HOME CARE INSTRUCTIONS   It is not unusual to pass moderate amounts of gas and experience mild abdominal cramping following the procedure. This is due to air being used to inflate your colon during the exam. Walking or a warm pack on your belly (abdomen) may help.   You may resume all normal meals and activities after sedatives and medicines have worn off.   Only take over-the-counter or prescription medicines for pain, discomfort, or fever as directed by your caregiver. Do not use aspirin or blood thinners if a biopsy was taken. Consult your caregiver for medicine usage if biopsies were taken.  SEEK IMMEDIATE MEDICAL CARE IF:   You have a fever.   You pass large blood clots or fill a toilet with blood following the procedure. This may also occur 10 to 14 days following the procedure. This is more likely if a biopsy was taken.   You develop abdominal pain that keeps getting worse and cannot be relieved with medicine.  Document Released: 02/13/2000 Document Revised: 10/28/2010 Document Reviewed: 09/28/2007 [facility] Patient Information 2012 [place], [place].

## 2010-12-29 NOTE — Progress Notes (Signed)
This is a 50 year old Hispanic female treated for C. difficile infection with tapering doses of vancomycin. She is much improved but continues with some soft stools and vague abdominal discomfort. She does have fibromyalgia ,chronic GERD on Protonix therapy and hyperlipidemia. 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 Repeat C. difficile toxin earlier this week was negative. She does have a sister who has had a partial colectomy for colon polyps. The patient has not had previous colonoscopy exam. Denies fever, chills, other systemic or hepatobiliary problems.  Current Medications, Allergies, Past Medical History, Past Surgical History, Family History and Social History were reviewed in [employer] record.  Pertinent Review of Systems Negative   Physical Exam: Awake and alert in no acute distress appearing her stated age. I cannot appreciate stigmata of chronic liver disease. Her abdominal exam is unremarkable without organomegaly, masses, tenderness, or abnormal bowel sounds. Rectal exam is deferred. Mental status is normal.    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 I've schedule colonoscopy for evaluation and colon polyp screening. Otherwise she is to continue medications as per primary care. Encounter Diagnoses  Name Primary?  . Diarrhea Yes  . C. difficile colitis

## 2010-12-30 ENCOUNTER — Ambulatory Visit (AMBULATORY_SURGERY_CENTER): Payer: PRIVATE HEALTH INSURANCE | Admitting: Gastroenterology

## 2010-12-30 ENCOUNTER — Encounter: Payer: Self-pay | Admitting: Gastroenterology

## 2010-12-30 VITALS — BP 129/61 | HR 64 | Temp 97.8°F | Resp 18 | Ht 59.0 in | Wt 160.0 lb

## 2010-12-30 DIAGNOSIS — A0472 Enterocolitis due to Clostridium difficile, not specified as recurrent: Secondary | ICD-10-CM

## 2010-12-30 DIAGNOSIS — Z8371 Family history of colonic polyps: Secondary | ICD-10-CM

## 2010-12-30 DIAGNOSIS — Z83719 Family history of colon polyps, unspecified: Secondary | ICD-10-CM

## 2010-12-30 DIAGNOSIS — R197 Diarrhea, unspecified: Secondary | ICD-10-CM

## 2010-12-30 HISTORY — DX: Family history of colonic polyps: Z83.71

## 2010-12-30 HISTORY — DX: Family history of colon polyps, unspecified: Z83.719

## 2010-12-30 MED ORDER — SODIUM CHLORIDE 0.9 % IV SOLN: 500.0000 mL | INTRAVENOUS | Status: DC

## 2010-12-30 NOTE — Patient Instructions (Signed)
Please refer to the blue and neon green sheets for instructions regarding diet and activity for the rest of today.  Normal exam. Resume previous medications. 

## 2010-12-31 ENCOUNTER — Telehealth: Payer: Self-pay

## 2010-12-31 NOTE — Telephone Encounter (Signed)

## 2011-01-18 ENCOUNTER — Other Ambulatory Visit (HOSPITAL_COMMUNITY)
Admission: RE | Admit: 2011-01-18 | Discharge: 2011-01-18 | Disposition: A | Discharge status: Home / Self Care | Payer: PRIVATE HEALTH INSURANCE | Source: Ambulatory Visit | Attending: Gynecology | Admitting: Gynecology

## 2011-01-18 ENCOUNTER — Encounter: Payer: Self-pay | Admitting: Gynecology

## 2011-01-18 ENCOUNTER — Ambulatory Visit (INDEPENDENT_AMBULATORY_CARE_PROVIDER_SITE_OTHER): Payer: PRIVATE HEALTH INSURANCE | Admitting: Gynecology

## 2011-01-18 VITALS — BP 124/70 | Ht 59.5 in | Wt 160.0 lb

## 2011-01-18 DIAGNOSIS — R635 Abnormal weight gain: Secondary | ICD-10-CM

## 2011-01-18 DIAGNOSIS — N899 Noninflammatory disorder of vagina, unspecified: Secondary | ICD-10-CM

## 2011-01-18 DIAGNOSIS — Z01419 Encounter for gynecological examination (general) (routine) without abnormal findings: Secondary | ICD-10-CM | POA: Insufficient documentation

## 2011-01-18 DIAGNOSIS — B3731 Acute candidiasis of vulva and vagina: Secondary | ICD-10-CM

## 2011-01-18 DIAGNOSIS — IMO0002 Reserved for concepts with insufficient information to code with codable children: Secondary | ICD-10-CM

## 2011-01-18 DIAGNOSIS — N644 Mastodynia: Secondary | ICD-10-CM

## 2011-01-18 DIAGNOSIS — B373 Candidiasis of vulva and vagina: Secondary | ICD-10-CM

## 2011-01-18 DIAGNOSIS — N912 Amenorrhea, unspecified: Secondary | ICD-10-CM

## 2011-01-18 DIAGNOSIS — N898 Other specified noninflammatory disorders of vagina: Secondary | ICD-10-CM

## 2011-01-18 MED ORDER — FLUCONAZOLE 150 MG PO TABS: 150.0000 mg | ORAL_TABLET | Freq: Once | ORAL | Status: AC

## 2011-01-18 NOTE — Progress Notes (Signed)
Rose Robertson Feb 23, 1961 [id number]        50 y.o.  for annual exam.  With a number of issues as outlined below.  Past medical history,surgical history, medications, allergies, family history and social history were all reviewed and documented in the EPIC chart. ROS:  Was performed and pertinent positives and negatives are included in the history.  Exam: chaperone present Filed Vitals:   01/18/11 1116  BP: 124/70   General appearance  Normal Skin grossly normal Head/Neck normal with no cervical or supraclavicular adenopathy thyroid normal Lungs  clear Cardiac RR, without RMG Abdominal  soft, nontender, without masses, organomegaly or hernia Breasts  examined lying and sitting without masses, retractions, discharge or axillary adenopathy.  Well-healed scars bilaterally from her augmentation and removal. Hypersensitive bilaterally in the tail of Spence the axillary regions left greater than right Pelvic  Ext/BUS/vagina  normal with white discharge  Cervix  normal  Pap done  Uterus  anteverted, normal size, shape and contour, midline and mobile nontender   Adnexa  Without masses or tenderness    Anus and perineum  normal   Rectovaginal  normal sphincter tone without palpated masses or tenderness.    Assessment/Plan:  50 y.o. female for annual exam.    1. Amenorrhea. Patient relates having regular menses through August and no menses since then. Not having significant symptoms such as hot flushes night sweats or other symptoms. We'll check baseline labs including hCG, prolactin, TSH, FSH. 2. Mastodynia. Patient is hypersensitive bilaterally left greater than right. She had a long history of tenderness and was followed at [facility], ultimately went to [facility] where Dr. Domenica Fail removed her implants. Also took out several nodules which were benign bilaterally. Her implant grew out methicillin resistant staph for which she was treated for and there is a question as  to whether she has a fungal infection also associated with the implant. She's still having a lot of discomfort although notes that she's been having the same type of pain for years.  She has an appointment to follow up at [facility] an MRI. I have encouraged her to follow up with them and to keep all of her care under the same practice either at [facility] or [place] as she has a complex case and I really think that she needs to have uniform follow up evaluation. Patient agrees to follow up with them.  I also discussed the need for mammography but again she'll follow with them in reference to this. 3. Abdominal bloating pelvic pain. Patient had been treated for C. Difficile and just underwent a colonoscopy last month by Dr. Jarold Motto and was told everything was okay. We'll plan baseline ultrasound to rule out ovarian process she'll schedule this. 4. Dyspareunia. Patient has both deep dyspareunia as well as irritative dyspareunia. A wet prep is positive for yeast we'll treat her with Diflucan 150x1 dose and hopefully this will take care of the superficial dyspareunia. She will follow for her ultrasound as above in reference to the deep dyspareunia. Her physical exam is normal on pelvic. 5. Health maintenance. She is under the care of Dr. Evelena Peat and reports getting all of her lab work done through his office. No routine labs were ordered today she'll continue to see them for this. Increase calcium vitamin D reviewed she'll follow up for ultrasound and lab work and then we'll go from there.     Dara Lords MD, 12:17 PM 01/18/2011

## 2011-01-25 ENCOUNTER — Encounter: Payer: Self-pay | Admitting: Gynecology

## 2011-01-25 ENCOUNTER — Ambulatory Visit (INDEPENDENT_AMBULATORY_CARE_PROVIDER_SITE_OTHER): Payer: PRIVATE HEALTH INSURANCE

## 2011-01-25 ENCOUNTER — Ambulatory Visit (INDEPENDENT_AMBULATORY_CARE_PROVIDER_SITE_OTHER): Payer: PRIVATE HEALTH INSURANCE | Admitting: Gynecology

## 2011-01-25 ENCOUNTER — Other Ambulatory Visit: Payer: Self-pay | Admitting: Gynecology

## 2011-01-25 DIAGNOSIS — N854 Malposition of uterus: Secondary | ICD-10-CM

## 2011-01-25 DIAGNOSIS — IMO0002 Reserved for concepts with insufficient information to code with codable children: Secondary | ICD-10-CM

## 2011-01-25 DIAGNOSIS — N8300 Follicular cyst of ovary, unspecified side: Secondary | ICD-10-CM

## 2011-01-25 DIAGNOSIS — D259 Leiomyoma of uterus, unspecified: Secondary | ICD-10-CM

## 2011-01-25 DIAGNOSIS — D251 Intramural leiomyoma of uterus: Secondary | ICD-10-CM

## 2011-01-25 DIAGNOSIS — D3910 Neoplasm of uncertain behavior of unspecified ovary: Secondary | ICD-10-CM

## 2011-01-25 DIAGNOSIS — N852 Hypertrophy of uterus: Secondary | ICD-10-CM

## 2011-01-25 DIAGNOSIS — N83209 Unspecified ovarian cyst, unspecified side: Secondary | ICD-10-CM

## 2011-01-25 NOTE — Patient Instructions (Signed)
Follow up for ultrasound in 6 months 

## 2011-01-25 NOTE — Progress Notes (Signed)
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 Left ovary normal. Discussed all this with the patient. She's planning observation at this time as she is not having overt symptoms from the leiomyoma. I want to reultrasound her in 6 months to relook at her right ovary and make sure this is a stable finding. I reviewed the differential with her to include calcified area, dermoid, active tumor. If area enlarges over time, I discussed possible surgery to remove. If stable in 6 months we'll plan expectant management. Patient agrees and is comfortable with the plan. She did take the Diflucan x1 has not had intercourse since then and we'll see if this eliminates her irritative dyspareunia.

## 2011-02-09 ENCOUNTER — Encounter: Payer: Self-pay | Admitting: Neurology

## 2011-02-09 ENCOUNTER — Ambulatory Visit (INDEPENDENT_AMBULATORY_CARE_PROVIDER_SITE_OTHER): Payer: PRIVATE HEALTH INSURANCE | Admitting: Neurology

## 2011-02-09 VITALS — BP 128/80 | HR 84 | Ht 59.0 in | Wt 162.0 lb

## 2011-02-09 DIAGNOSIS — R51 Headache: Secondary | ICD-10-CM

## 2011-02-09 MED ORDER — DULOXETINE HCL 30 MG PO CPEP: ORAL_CAPSULE | ORAL | Status: DC

## 2011-02-09 NOTE — Patient Instructions (Signed)
Your prescription has been sent to your pharmacy.  We will contact with regarding your three month follow up appointment with Dr. Modesto Charon.

## 2011-02-09 NOTE — Progress Notes (Signed)
Dear Dr. Caryl Never,  I saw  Rose Robertson back in [facility] for her problem withwith right hemibody sensory change, memory problems, headaches, vertigo and spells of zoning out. As you may recall, she is a 50 y.o. year old female with a history of multiple somatic complaints, anxiety and depression, possible infection s/p infected breast implants who developed the sudden onset of tingling and numbness of her left hemibody in February 2008. She first though it was a stroke, but then was told she had "Bell's Palsy". She said the numbness went away over several months, but then recurred in 2009 and has persisted.   She also complains of zoning out spells as well as spells of loss of consciousness. She once felt herself slump to the floor in the shower and was "out" for an indeterminate length of time. She has multiple zoning out spells a week, that last minutes.   She complains of wavy lines that come and go in her vision as well. These are not related to the headaches.   She believe this is all related to her infection of her breast implants which require a subsequent revision for which she is on chronic anti-microbials(vancomycin, fluconazole). She has been told that her infection must have gotten in her brain.   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  She endorses panic attacks but not depression.  She is filing for disability because of her symptoms.  When I first saw her I felt seizures as being a cause of her spells of zoning out were unlikely .  I did order an EEG and this was normal.  I also got memory testing, as I felt that likely her memory problems were due to psychologic factors.  She just completed the memory testing, but the results are not yet available.  She continues to complain of left hemibody sensory changes,  she did have an EMG/NCS done a few years ago that was reportedly normal.  She says that the numbness is worse in the  left 4th and 5th digit as well as the second toe of the left foot.  They are constant.  She also continues to get frequent headaches, that are throbbing with photophobia at least once per week.   Medical history, social history, and family history were reviewed and have not changed since the last clinic visit.  Current Outpatient Prescriptions on File Prior to Visit  Medication Sig Dispense Refill  . B Complex Vitamins (VITAMIN B COMPLEX PO) Take by mouth.        . cholecalciferol (VITAMIN D) 1000 UNITS tablet Take 1,000 Units by mouth daily.        . nebivolol (BYSTOLIC) 5 MG tablet Take 1 and 1/2 tab daily       . pantoprazole (PROTONIX) 40 MG tablet Take 40 mg by mouth daily.        . pravastatin (PRAVACHOL) 40 MG tablet Take 40 mg by mouth daily.         Current Facility-Administered Medications on File Prior to Visit  Medication Dose Route Frequency Provider Last Rate Last Dose  . 0.9 %  sodium chloride infusion  500 mL Intravenous Continuous Sheryn Bison, MD      . TDaP (BOOSTRIX) injection 0.5 mL  0.5 mL Intramuscular Once Bruce W Burchette        Allergies  Allergen Reactions  . Sulfa Antibiotics     Viviann Spare Johnson's syndrome  . Latex     rash  ROS:  13 systems were reviewed and are notable for diffuse pain, thought to be secondary to "fibromyalgia".  All other review of systems are unremarkable.  Exam: . Filed Vitals:   02/09/11 1053  BP: 128/80  Pulse: 84  Height: 4\' 11"  (1.499 m)  Weight: 162 lb (73.483 kg)    In general, dysphoric, tired appearing woman.   Cranial Nerves: Pupils are equally round and reactive to light.  Extraocular movements are intact without nystagmus. Facial sensation INCREASED to temperature on left face and muscles of mastication are intact. Muscles of facial expression are symmetric. Hearing intact to bilateral finger rub. Tongue protrusion, uvula, palate midline.  Shoulder shrug intact  Motor:  Normal bulk and tone, no drift and  5/5 muscle strength bilaterally.  Reflexes:  3+ thoughout, toes down.  Coordination:  Normal finger to nose.  Sensation:  Decreased to temperature on 4th and 5th digit of left hand.  No Tinel's sign at elbow.  Decreased in second toe of left foot.  Gait:  Normal gait and station.  Romberg unsteady.    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  Her headaches seem to be causing significant disability.  I have offered her Cymbalta 30mg  -> 60mg  to see if this helps as a preventative for her headaches.  This may also help with her "fibromyalgia".  She will use Aleve as an abortive for her headaches.  At a later date I may consider an EMG/NCS for her left arm sensory changes, but again I think this is going to be unrevealing.  I will also await the results of her memory testing, but I suspect this will show a significant amount of contribution from her underlying mood disorder.  We will see the patient back in 3 months.  Lupita Raider Modesto Charon, MD [facility], [place]

## 2011-02-11 ENCOUNTER — Telehealth: Payer: Self-pay | Admitting: Neurology

## 2011-02-11 NOTE — Telephone Encounter (Signed)
Call received from the patient. She states she started the Cymbalta two days ago and reports some mild nausea. She is taking the 30 mg tablet in the morning with food. She says the nausea isn't pleasant but tolerable. She wanted to know if she could take something OTC for it. I told her that the nausea may subside on its own and to try and wait it out a few more days. If it continued, she may not be able to tolerate the medication. I asked the patient to call with an update on her symptoms on Monday. She states she will. She also asked if Dr. Modesto Charon was going to do lab work every 2 months while she is taking this medication in order to keep an eye on her liver. She states she has a friend that takes this medication too and they have frequent blood work. I told her I would ask Dr. Modesto Charon about this and let her know when she calls me back on Monday. The patient agrees with this plan. **Dr. Modesto Charon please advise. Thank you.

## 2011-02-12 ENCOUNTER — Other Ambulatory Visit: Payer: Self-pay | Admitting: Neurology

## 2011-02-12 DIAGNOSIS — R51 Headache: Secondary | ICD-10-CM

## 2011-02-12 DIAGNOSIS — R200 Anesthesia of skin: Secondary | ICD-10-CM

## 2011-02-12 NOTE — Telephone Encounter (Signed)
you can let her know that we don't usually do ongoing monitoring of Cymbalta.  However, after she is on it two months we can check her CMP and CBC to make sure she is not having any major problems.  That is usually sufficient, unless of course she develops other symptoms that would make [place] suspicious of a problem with her liver or kidneys.  Jan - could you set up a CMP and CBC in two months.  Thx.

## 2011-02-12 NOTE — Telephone Encounter (Signed)
I left a message for the patient to call me at her home and mobile number.

## 2011-02-15 NOTE — Telephone Encounter (Signed)
I called the patient again. I did speak with her. I told her about the lab work in relation to her taking Cymbalta. We will do the CMP and CBC when she sees Dr. Modesto Charon in March for f/u. The pt is aware she will receive a letter in the mail prompting her to call and make an appointment. She is in agreement with this plan. She also wanted me to ask Dr. Modesto Charon if there was anything she could do for her vertigo. She states it is constant and has been a problem since 2008. She has tried organic drops of some sort as well as Antivert--both were not helpful and she could not tolerate the side effects of either. She also wanted me to tell Dr. Modesto Charon that her Oncologist gave her a clean bill of health and only needs to f/u once a year. I told her that I would let him know. **Dr. Modesto Charon, please advise vertigo. Thanks.

## 2011-02-16 NOTE — Telephone Encounter (Signed)
If you can let her know that I would like to see first if the Cymbalta helps.  I would not start two medications at once.  I think if we can make her headaches better her dizziness may get better as well.

## 2011-02-16 NOTE — Telephone Encounter (Signed)
Called and spoke with the patient. Information given as per Dr. Modesto Charon below. The patient voiced no other issues.

## 2011-06-10 ENCOUNTER — Other Ambulatory Visit: Payer: Self-pay | Admitting: Nurse Practitioner

## 2011-06-10 DIAGNOSIS — R102 Pelvic and perineal pain: Secondary | ICD-10-CM

## 2011-06-17 ENCOUNTER — Ambulatory Visit
Admission: RE | Admit: 2011-06-17 | Discharge: 2011-06-17 | Disposition: A | Discharge status: Home / Self Care | Payer: PRIVATE HEALTH INSURANCE | Source: Ambulatory Visit | Attending: Nurse Practitioner | Admitting: Nurse Practitioner

## 2011-06-17 DIAGNOSIS — R102 Pelvic and perineal pain: Secondary | ICD-10-CM

## 2011-06-17 MED ORDER — GADOBENATE DIMEGLUMINE 529 MG/ML IV SOLN
15.0000 mL | Freq: Once | INTRAVENOUS | Status: AC | PRN
  Administered 2011-06-17: 15 mL via INTRAVENOUS

## 2011-07-13 ENCOUNTER — Other Ambulatory Visit: Payer: Self-pay | Admitting: Nurse Practitioner

## 2011-07-13 ENCOUNTER — Ambulatory Visit (INDEPENDENT_AMBULATORY_CARE_PROVIDER_SITE_OTHER): Payer: PRIVATE HEALTH INSURANCE | Admitting: Gynecology

## 2011-07-13 ENCOUNTER — Encounter: Payer: Self-pay | Admitting: Gynecology

## 2011-07-13 DIAGNOSIS — N926 Irregular menstruation, unspecified: Secondary | ICD-10-CM

## 2011-07-13 DIAGNOSIS — R109 Unspecified abdominal pain: Secondary | ICD-10-CM

## 2011-07-13 DIAGNOSIS — D259 Leiomyoma of uterus, unspecified: Secondary | ICD-10-CM

## 2011-07-13 DIAGNOSIS — N839 Noninflammatory disorder of ovary, fallopian tube and broad ligament, unspecified: Secondary | ICD-10-CM

## 2011-07-13 DIAGNOSIS — N838 Other noninflammatory disorders of ovary, fallopian tube and broad ligament: Secondary | ICD-10-CM

## 2011-07-13 DIAGNOSIS — N951 Menopausal and female climacteric states: Secondary | ICD-10-CM

## 2011-07-13 NOTE — Progress Notes (Signed)
Patient presents in follow up. She was seen November 2012 for her annual exam.  She had ultrasound which showed multiple small myomas echogenic focus in the right ovary and was to follow up in 6 months for restudy.  She has been having a lot of lower abdominal pain and was evaluated by her primary physician to include an MRI which confirmed her multiple small myomas and the echogenic right ovarian focus. In review of the report the leiomyoma have not changed in size in fact seem a little smaller in the echogenic focus also has not changed or if anything is a little smaller. She was diagnosed with intestinal parasites and is being treated for that now.  Exam with Sherrilyn Rist chaperone present Abdomen with mild periumbilical tenderness. No rebound guarding masses or organomegaly. Pelvic external BUS vagina normal. Cervix normal. Uterus retroverted grossly normal in size. Nontender. Adnexa without masses or tenderness  Assessment and plan: Leiomyoma, stable. 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  Abdominal pain in patient actively being treated for enteric parasites. She will continue follow up with her primary in reference to this.   She does have a maternal aunt who developed ovarian cancer in her 41s. No other history of ovarian cancer or breast cancer. She asked about whether she should have her ovaries removed and I reviewed the risk/benefit ratio and did not feel based on that history prophylactic removal would be warranted. I did encourage her to consider stop smoking as I feel this a greater risk to her health.    She was started on progesterone cream by her primary due to sleep disturbances and menopausal symptoms. She had an elevated FSH when I saw her in the fall and notes that her periods are becoming more irregular but now she's had several that are regular. I reviewed the issues of progesterone only supplementation, WHI study, the risks of breast cancer and other unknown  risks. She will follow up with her primary for management of this at her choice. She will follow up with me in the fall for her annual exam and will consider reultrasound at that time to relook at her leiomyoma and this echogenic focus.

## 2011-07-13 NOTE — Patient Instructions (Signed)
Continue to follow up with her primary in reference to your abdominal pain and parasitic infection. Follow up with me in the fall for your annual gynecologic exam and planned repeat ultrasound.

## 2011-07-15 ENCOUNTER — Other Ambulatory Visit: Payer: Self-pay | Admitting: Family Medicine

## 2011-07-15 DIAGNOSIS — N644 Mastodynia: Secondary | ICD-10-CM

## 2011-07-15 DIAGNOSIS — N6489 Other specified disorders of breast: Secondary | ICD-10-CM

## 2011-07-19 ENCOUNTER — Ambulatory Visit
Admission: RE | Admit: 2011-07-19 | Discharge: 2011-07-19 | Disposition: A | Discharge status: Home / Self Care | Payer: PRIVATE HEALTH INSURANCE | Source: Ambulatory Visit | Attending: Nurse Practitioner | Admitting: Nurse Practitioner

## 2011-07-21 ENCOUNTER — Other Ambulatory Visit: Payer: PRIVATE HEALTH INSURANCE

## 2011-07-29 ENCOUNTER — Ambulatory Visit
Admission: RE | Admit: 2011-07-29 | Discharge: 2011-07-29 | Disposition: A | Discharge status: Home / Self Care | Payer: PRIVATE HEALTH INSURANCE | Source: Ambulatory Visit | Attending: Family Medicine | Admitting: Family Medicine

## 2011-07-29 DIAGNOSIS — N6489 Other specified disorders of breast: Secondary | ICD-10-CM

## 2011-07-29 DIAGNOSIS — N644 Mastodynia: Secondary | ICD-10-CM

## 2011-09-13 ENCOUNTER — Ambulatory Visit (HOSPITAL_BASED_OUTPATIENT_CLINIC_OR_DEPARTMENT_OTHER): Admission: RE | Admit: 2011-09-13 | Payer: PRIVATE HEALTH INSURANCE | Source: Ambulatory Visit | Admitting: Gynecology

## 2011-09-13 ENCOUNTER — Ambulatory Visit (INDEPENDENT_AMBULATORY_CARE_PROVIDER_SITE_OTHER): Payer: PRIVATE HEALTH INSURANCE | Admitting: Gynecology

## 2011-09-13 ENCOUNTER — Encounter: Payer: Self-pay | Admitting: Gynecology

## 2011-09-13 ENCOUNTER — Encounter (HOSPITAL_BASED_OUTPATIENT_CLINIC_OR_DEPARTMENT_OTHER): Admission: RE | Payer: Self-pay | Source: Ambulatory Visit

## 2011-09-13 DIAGNOSIS — D259 Leiomyoma of uterus, unspecified: Secondary | ICD-10-CM

## 2011-09-13 DIAGNOSIS — N949 Unspecified condition associated with female genital organs and menstrual cycle: Secondary | ICD-10-CM

## 2011-09-13 DIAGNOSIS — N926 Irregular menstruation, unspecified: Secondary | ICD-10-CM

## 2011-09-13 DIAGNOSIS — R102 Pelvic and perineal pain: Secondary | ICD-10-CM

## 2011-09-13 SURGERY — HYSTERECTOMY, VAGINAL, LAPAROSCOPY-ASSISTED
Anesthesia: General

## 2011-09-13 NOTE — Patient Instructions (Signed)
Office will contact you to schedule surgery. 

## 2011-09-13 NOTE — Progress Notes (Signed)
Patient presents with a complex history to include having been evaluated here last fall with multiple myomas and a small echogenic area on the right ovary suggestive of a small dermoid or calcified area. She was managed expectantly. She ultimately saw her primary Elie Goody who initiated HRT due to menopausal symptoms that were unacceptable to the patient. She did have an elevated FSH when I saw her in the fall. She began with irregular bleeding with "menses" every 2 weeks and was having a lot of low back pain and saw Dr. Nicholas Lose in referral. He did an ultrasound which showed multiple myomas as did our ultrasound before and a small calcified area in the right ovary which appears overall stable from our prior ultrasound in November 2012, and had her scheduled for a hysterectomy right salpingo-oophorectomy. This was canceled apparently due to Dr. Nicholas Lose and electronic medical records issues and she follows up with me to consider surgery. Her main complaint is low back pain pelvic discomfort. Her hot flashes are better on her HRT which includes and under arm lotion nightly and a pill at bedtime both formulated through a local pharmacy.  Exam is consistent Abdomen soft nontender without masses guarding rebound organomegaly. Pelvic external BUS vagina normal. Cervix normal. Uterus retroverted globoid irregular consistent with myomas. Adnexa without gross masses or tenderness. Rectovaginal exam normal.  Assessment and plan: Irregular bleeding on HRT. Leiomyoma, pelvic discomfort. Calcified right ovarian small area questionable small dermoid versus calcified stroma. I reviewed options with the patient to include a conservative approach of changing her HRT to try to eliminate the irregular bleeding. Patient seems more concerned about her pelvic pain and feels very strongly that she wants to proceed with hysterectomy as originally planned by Dr. Nicholas Lose. I reviewed her age and the perimenopausal time frame and if we can  get her through the menopause that we could maybe avoid surgery but she is not interested and wants to proceed with hysterectomy. She also wants both ovaries removed. I reviewed the issues of decreased overall mortality even in the peri-or early postmenopausal women who've maintain her ovarian function but she does have a distant ovarian cancer history and is worried about this and wants to have both ovaries removed. I had a lengthy discussion about the general risks of surgery including her prior abdominal surgeries increasing the risk of internal organ damage including bowel bladder ureters vessels and nerves necessitating major exploratory reparative surgeries and future reparative surgeries including ostomy formation was all discussed.  I also reviewed HRT with her the risks to include the WHI study with increased risk of stroke heart attack DVT as well as increased risk of breast cancer. She does have elevated cholesterol and hypertension she does smoke and this all compounding her thrombotic risk and cardiovascular risk was reviewed with her.  I also reviewed with her there are no guarantees about her pelvic pain. We certainly will limiting her bleeding was hysterectomy but her pain may persist worsen or change and she understands and accepts this. The patient has had 2 vaginal deliveries. We'll plan laparoscopic assistance to assure freeing the ovaries and plan TLH/LAVH approach. Patient will represent for full preoperative consult and obtain a preoperative clearance from her primary physician.  Did discuss with her after the surgery if she chooses to continue ERT which it sounds like she will given her hot flash history that we could eliminate the progesterone and just go with estrogen only having understood and accepted the risks as outlined above.

## 2011-09-14 ENCOUNTER — Telehealth: Payer: Self-pay | Admitting: Gynecology

## 2011-09-14 ENCOUNTER — Ambulatory Visit: Payer: PRIVATE HEALTH INSURANCE | Admitting: Gynecology

## 2011-09-14 NOTE — Telephone Encounter (Signed)
I spoke with patient to see when she would like to schedule her hysterectomy.  She said she would like to know how much she would need to pay. I spoke with her insurance company for benefits and informed her $1019 in advance of surgery for [facility] fees.  She said that last night her AC went out and her clothes dryer so she is going to have some financial constraints at the moment. She asked me to go ahead and send her the financial letter with all the info regarding fees/ins benefits and let her consider and she will call me when ready to schedule. Letter is mailed to patient.

## 2011-11-10 ENCOUNTER — Telehealth: Payer: Self-pay | Admitting: Gynecology

## 2011-11-10 NOTE — Telephone Encounter (Signed)
Patient called and requested to r/s her surgery to a Monday.  R/s her to Monday, oct 21 7:30am and pre-op consult scheduled for Oct 8 at 11am.

## 2011-11-16 ENCOUNTER — Telehealth: Payer: Self-pay | Admitting: Gynecology

## 2011-11-16 NOTE — Telephone Encounter (Signed)
Patient called to see if she could reschedule her surgery yet again. We have already rescheduled it before.  She asked for it to be earlier than Monday, Oct 21 but I do not have a 7:30am slot available before that time and it is not an emergency surgery.  She is wanting to travel to [place] and I think the ticket prices may be better earlier.  She plans to recover there with family.  I told her that the 21st ist the first available date and we will leave it scheduled as planned.  However, she did want me to check with Dr. Velvet Bathe to see how soon she will be able to fly to [place] after surgery.  She is going there to recover with family and does not plan to return until January 2014.  She knows Dr. Velvet Bathe off and I will call her next week.

## 2011-11-17 NOTE — Telephone Encounter (Signed)
Patient informed. 

## 2011-11-17 NOTE — Telephone Encounter (Signed)
Safest to wait 2 weeks to allow for any post operative complications.  She does not want to have complications out of town and see doctors who didn't do surgery.

## 2011-12-02 ENCOUNTER — Encounter (HOSPITAL_COMMUNITY): Payer: Self-pay | Admitting: Pharmacist

## 2011-12-07 ENCOUNTER — Encounter: Payer: Self-pay | Admitting: Gynecology

## 2011-12-07 ENCOUNTER — Ambulatory Visit (INDEPENDENT_AMBULATORY_CARE_PROVIDER_SITE_OTHER): Payer: PRIVATE HEALTH INSURANCE | Admitting: Gynecology

## 2011-12-07 DIAGNOSIS — D259 Leiomyoma of uterus, unspecified: Secondary | ICD-10-CM

## 2011-12-07 DIAGNOSIS — IMO0002 Reserved for concepts with insufficient information to code with codable children: Secondary | ICD-10-CM

## 2011-12-07 DIAGNOSIS — N920 Excessive and frequent menstruation with regular cycle: Secondary | ICD-10-CM

## 2011-12-07 DIAGNOSIS — D369 Benign neoplasm, unspecified site: Secondary | ICD-10-CM

## 2011-12-07 DIAGNOSIS — N926 Irregular menstruation, unspecified: Secondary | ICD-10-CM

## 2011-12-07 LAB — CBC WITH DIFFERENTIAL/PLATELET
Basophils Absolute: 0 10*3/uL (ref 0.0–0.1)
Basophils Relative: 0 % (ref 0–1)
Eosinophils Absolute: 0.1 10*3/uL (ref 0.0–0.7)
Eosinophils Relative: 1 % (ref 0–5)
HCT: 39 % (ref 36.0–46.0)
Hemoglobin: 13.3 g/dL (ref 12.0–15.0)
Lymphocytes Relative: 26 % (ref 12–46)
Lymphs Abs: 3.3 10*3/uL (ref 0.7–4.0)
MCH: 30.5 pg (ref 26.0–34.0)
MCHC: 34.1 g/dL (ref 30.0–36.0)
MCV: 89.4 fL (ref 78.0–100.0)
Monocytes Absolute: 0.8 10*3/uL (ref 0.1–1.0)
Monocytes Relative: 7 % (ref 3–12)
Neutro Abs: 8.2 10*3/uL — ABNORMAL HIGH (ref 1.7–7.7)
Neutrophils Relative %: 66 % (ref 43–77)
Platelets: 396 10*3/uL (ref 150–400)
RBC: 4.36 MIL/uL (ref 3.87–5.11)
RDW: 13 % (ref 11.5–15.5)
WBC: 12.4 10*3/uL — ABNORMAL HIGH (ref 4.0–10.5)

## 2011-12-07 MED ORDER — MEGESTROL ACETATE 20 MG PO TABS: 20.0000 mg | ORAL_TABLET | Freq: Every day | ORAL | Status: DC

## 2011-12-07 NOTE — Progress Notes (Signed)
Rose Robertson 1960-09-29 [id number]   Preoperative consultation  Chief complaint: menorrhagia, irregular menses, leiomyoma, pelvic pain, intermittent deep dyspareunia  History of present illness: 51 y.o. G2P2002 with history of this past year of worsening menstrual irregularity and menorrhagia. She currently is bleeding every 2 weeks with passage of clots and free-flowing into the toilet during her menses.  She is experiencing worsening pelvic pain with low back radiation and intermittent deep dyspareunia as if something is being hit.  Evaluations shows a bulky uterus on exam and ultrasound/MRI confirmed multiple small myomas and a calcific area in the right ovary suggestive of a dermoid. She also has developed perimenopausal symptoms to include hot flushes and sweats and has been started on a bioequivalent regimen by her primary provider.  Options for management have been reviewed with her to include attempted conservative treatment, recognizing that she is in the perimenopause but the patient finds her current situation acceptable and she is admitted for definitive treatment to include laparoscopic assisted hysterectomy and right salpingo-oophorectomy.  Past medical history,surgical history, medications, allergies, family history and social history were all reviewed and documented in the EPIC chart. ROS:  Was performed and pertinent positives and negatives are included in the history of present illness.  Exam:  Higher education careers adviser General: well developed, well nourished female, no acute distress HEENT: normal  Lungs: clear to auscultation without wheezing, rales or rhonchi  Cardiac: regular rate without rubs, murmurs or gallops  Abdomen: soft, nontender without masses, guarding, rebound, organomegaly  Pelvic: external bus vagina: normal   Cervix: grossly normal  Uterus: bulky, retroverted midline and mobile, nontender  Adnexa: without masses or tenderness      Assessment/Plan:  51  y.o. [id number] with the above history admitted for laparoscopic assisted hysterectomy and right salpingo-oophorectomy. The patient initially desired both ovaries removed when we discussed this in July but she subsequently has done some reading and prefers to keep one ovary if possible to allow for a more natural transition through menopause. I discussed with her the ovarian conservation issue. The issue of hormone production in the perimenopause albeit declining as evidenced by her need for HRT was reviewed and the possible benefit of keeping her ovary from an overall mortality standpoint versus the risk of developing problems with that ovary to include pain, benign disease and ovarian cancer was reviewed with her. She does have a family history of ovarian cancer in an elderly aunt and she clearly understands by keeping her ovaries she is at risk for disease both benign and malignant in that ovary and the possible need for treatment in the future and she accepts this. She does give me permission to remove both ovaries if significant disease is encountered or complications arise.I do recommend removing the right ovary given the small nature of the probable dermoid that it would be difficult to locate this and remove it totally and conserved the ovary as this may leave some remaining dermoid and she agrees with this.  She also understands that we will initiate a laparoscopic approach but at any time during the procedure if scarring or complications arise that I may convert to a total abdominal hysterectomy and she understands and accepts this with a larger incision and a longer recovery period. Sexuality following hysterectomy was also reviewed and the potential for orgasmic dysfunction as well as persistent dyspareunia was discussed understood and accepted. The patient understands the absolute irreversible sterility associated with hysterectomy. The expected intraoperative/postoperative courses were reviewed and the  risks of procedure discussed  to include the risks of infection, prolonged antibiotics, abscess/hematoma development with reoperation and drainage. The risk of hemorrhage necessitating transfusion and the risks of transfusion to include transfusion reaction hepatitis HIV mad cow disease and other unknown entities were reviewed. Incisional complications requiring opening and draining of incisions, closure by secondary intention and long-term issues of cosmetic/keloid and hernia formation in incision sites reviewed. The risk of inadvertent injury to internal organs including bowel ladder ureters vessels and nerves either immediately recognized or delay recognized requiring major exploratory reparative surgeries and future reparative surgeries including ostomy formation, bowel resection, bladder repair and ureteral damage repair was all discussed understood and accepted. The patient will continue with her follow up with her primary provider in reference to her bioequivalent hormone replacement following surgery. Her questions were answered to her satisfaction and she is ready to proceed with surgery. I'm going to check a baseline CBC now given her bleeding history and I did recommend starting on Megace 20 mg daily through surgery to help suppress bleeding and conserve her hemoglobin.    Dara Lords MD, 3:14 PM 12/07/2011

## 2011-12-07 NOTE — Patient Instructions (Signed)
Follow up for surgery as scheduled. Call sooner if any issues or questions. Start on Megace daily to help suppress periods.

## 2011-12-07 NOTE — H&P (Addendum)
Rose Robertson [birth year] [id number]   History and Physical  Chief complaint: menorrhagia, irregular menses, leiomyoma, pelvic pain, intermittent deep dyspareunia  History of present illness: 51 y.o. G2P2002 with history of this past year of worsening menstrual irregularity and menorrhagia. She currently is bleeding every 2 weeks with passage of clots and free-flowing into the toilet during her menses.  She is experiencing worsening pelvic pain with low back radiation and intermittent deep dyspareunia as if something is being hit.  Evaluations shows a bulky uterus on exam and ultrasound/MRI confirmed multiple small myomas and a calcific area in the right ovary suggestive of a dermoid. She also has developed perimenopausal symptoms to include hot flushes and sweats and has been started on a bioequivalent regimen by her primary provider.  Options for management have been reviewed with her to include attempted conservative treatment, recognizing that she is in the perimenopause but the patient finds her current situation acceptable and she is admitted for definitive treatment to include laparoscopic assisted hysterectomy and right salpingo-oophorectomy.  Past medical history,surgical history, medications, allergies, family history and social history were all reviewed and documented in the EPIC chart. ROS:  Was performed and pertinent positives and negatives are included in the history of present illness.  Exam:  Higher education careers adviser General: well developed, well nourished female, no acute distress HEENT: normal  Lungs: clear to auscultation without wheezing, rales or rhonchi  Cardiac: regular rate without rubs, murmurs or gallops  Abdomen: soft, nontender without masses, guarding, rebound, organomegaly  Pelvic: external bus vagina: normal   Cervix: grossly normal  Uterus: bulky, retroverted midline and mobile, nontender  Adnexa: without masses or tenderness      Assessment/Plan:  51  y.o. [id number] with the above history admitted for laparoscopic assisted hysterectomy and right salpingo-oophorectomy. The patient initially desired both ovaries removed when we discussed this in July but she subsequently has done some reading and prefers to keep one ovary if possible to allow for a more natural transition through menopause. I discussed with her the ovarian conservation issue. The issue of hormone production in the perimenopause albeit declining as evidenced by her need for HRT was reviewed and the possible benefit of keeping her ovary from an overall mortality standpoint versus the risk of developing problems with that ovary to include pain, benign disease and ovarian cancer was reviewed with her. She does have a family history of ovarian cancer in an elderly aunt and she clearly understands by keeping her ovaries she is at risk for disease both benign and malignant in that ovary and the possible need for treatment in the future and she accepts this. She does give me permission to remove both ovaries if significant disease is encountered or complications arise.I do recommend removing the right ovary given the small nature of the probable dermoid that it would be difficult to locate this and remove it totally and conserved the ovary as this may leave some remaining dermoid and she agrees with this.  She also understands that we will initiate a laparoscopic approach but at any time during the procedure if scarring or complications arise that I may convert to a total abdominal hysterectomy and she understands and accepts this with a larger incision and a longer recovery period. Sexuality following hysterectomy was also reviewed and the potential for orgasmic dysfunction as well as persistent dyspareunia was discussed understood and accepted. The patient understands the absolute irreversible sterility associated with hysterectomy. The expected intraoperative/postoperative courses were reviewed and the  risks of procedure  discussed to include the risks of infection, prolonged antibiotics, abscess/hematoma development with reoperation and drainage. The risk of hemorrhage necessitating transfusion and the risks of transfusion to include transfusion reaction hepatitis HIV mad cow disease and other unknown entities were reviewed. Incisional complications requiring opening and draining of incisions, closure by secondary intention and long-term issues of cosmetic/keloid and hernia formation in incision sites reviewed. The risk of inadvertent injury to internal organs including bowel ladder ureters vessels and nerves either immediately recognized or delay recognized requiring major exploratory reparative surgeries and future reparative surgeries including ostomy formation, bowel resection, bladder repair and ureteral damage repair was all discussed understood and accepted. The patient will continue with her follow up with her primary provider in reference to her bioequivalent hormone replacement following surgery. Her questions were answered to her satisfaction and she is ready to proceed with surgery.  Addendum 12/09/2011: Patient calls office and has changed her mind and wants both ovaries removed. We discussed the issues and she understands the issues of accelerated menopausal symptoms, bone loss, cardiovascular risks. She currently is on bioequivalent HRT and understands the risks associated with this.    Dara Lords MD, 3:41 PM 12/07/2011

## 2011-12-09 ENCOUNTER — Telehealth: Payer: Self-pay | Admitting: Gynecology

## 2011-12-09 NOTE — Telephone Encounter (Signed)
Patient said when she discussed surgery with you she was wanting to keep her left ovary.  However, since thinking about it and discussing with her family she has decided that she does indeed want you "to remove the left ovary and go ahead with the full hysterectomy".

## 2011-12-10 NOTE — Telephone Encounter (Signed)
I contacted patient and let her know that I got her message and Dr. Velvet Bathe has been informed.  She said her sister reminded her that they had two cousins to die with ovarian cancer. The patient said she  is very comfortable with her decision to remove her ovary.

## 2011-12-13 ENCOUNTER — Telehealth: Payer: Self-pay | Admitting: *Deleted

## 2011-12-13 ENCOUNTER — Encounter (HOSPITAL_COMMUNITY)
Admission: RE | Admit: 2011-12-13 | Discharge: 2011-12-13 | Disposition: A | Discharge status: Home / Self Care | Payer: PRIVATE HEALTH INSURANCE | Source: Ambulatory Visit | Attending: Gynecology | Admitting: Gynecology

## 2011-12-13 ENCOUNTER — Encounter (HOSPITAL_COMMUNITY): Payer: Self-pay

## 2011-12-13 HISTORY — DX: Other reaction to spinal and lumbar puncture: G97.1

## 2011-12-13 HISTORY — DX: Cardiac arrhythmia, unspecified: I49.9

## 2011-12-13 LAB — COMPREHENSIVE METABOLIC PANEL
ALT: 20 U/L (ref 0–35)
AST: 19 U/L (ref 0–37)
Albumin: 3.8 g/dL (ref 3.5–5.2)
Alkaline Phosphatase: 66 U/L (ref 39–117)
BUN: 12 mg/dL (ref 6–23)
CO2: 21 mEq/L (ref 19–32)
Calcium: 9.4 mg/dL (ref 8.4–10.5)
Chloride: 103 mEq/L (ref 96–112)
Creatinine, Ser: 0.65 mg/dL (ref 0.50–1.10)
GFR calc Af Amer: >90 mL/min (ref >90)
GFR calc non Af Amer: >90 mL/min (ref >90)
Glucose, Bld: 90 mg/dL (ref 70–99)
Potassium: 3.8 mEq/L (ref 3.5–5.1)
Sodium: 138 mEq/L (ref 135–145)
Total Bilirubin: 0.2 mg/dL — ABNORMAL LOW (ref 0.3–1.2)
Total Protein: 6.8 g/dL (ref 6.0–8.3)

## 2011-12-13 LAB — CBC
HCT: 37.7 % (ref 36.0–46.0)
Hemoglobin: 12.8 g/dL (ref 12.0–15.0)
MCH: 29.7 pg (ref 26.0–34.0)
MCHC: 34 g/dL (ref 30.0–36.0)
MCV: 87.5 fL (ref 78.0–100.0)
Platelets: 349 10*3/uL (ref 150–400)
RBC: 4.31 MIL/uL (ref 3.87–5.11)
RDW: 13.2 % (ref 11.5–15.5)
WBC: 10.7 10*3/uL — ABNORMAL HIGH (ref 4.0–10.5)

## 2011-12-13 LAB — SURGICAL PCR SCREEN
MRSA, PCR: NEGATIVE
Staphylococcus aureus: NEGATIVE

## 2011-12-13 NOTE — Telephone Encounter (Signed)
Left on pt voicemail CBC result WBC elevated a little, but looks like this is normal for her due to pass results. Left message for her to call if questions.

## 2011-12-13 NOTE — Patient Instructions (Signed)
Your procedure is scheduled on:12/20/11  Enter through the Main Entrance at :6am Pick up desk phone and dial [phone] and inform us of your arrival.  Please call [phone] if you have any problems the morning of surgery.  Remember: Do not eat after midnight:Sunday Do not drink after:midnight Sunday  Take these meds the morning of surgery with a sip of water:Bystolic, Pantoprazole  DO NOT wear jewelry, eye make-up, lipstick,body lotion, or dark fingernail polish. Do not shave for 48 hours prior to surgery.  If you are to be admitted after surgery, leave suitcase in car until your room has been assigned. Patients discharged on the day of surgery will not be allowed to drive home.   Remember to use your Hibiclens as instructed.

## 2011-12-19 MED ORDER — DEXTROSE 5 % IV SOLN
2.0000 g | INTRAVENOUS | Status: AC
  Administered 2011-12-20: 2 g via INTRAVENOUS
  Filled 2011-12-19: qty 2

## 2011-12-20 ENCOUNTER — Encounter (HOSPITAL_COMMUNITY): Payer: Self-pay | Admitting: *Deleted

## 2011-12-20 ENCOUNTER — Encounter (HOSPITAL_COMMUNITY): Payer: Self-pay

## 2011-12-20 ENCOUNTER — Encounter (HOSPITAL_COMMUNITY): Payer: Self-pay | Admitting: Anesthesiology

## 2011-12-20 ENCOUNTER — Encounter (HOSPITAL_COMMUNITY)
Admission: RE | Disposition: A | Discharge status: Home / Self Care | Payer: Self-pay | Source: Ambulatory Visit | Attending: Gynecology

## 2011-12-20 ENCOUNTER — Ambulatory Visit (HOSPITAL_COMMUNITY)
Admission: RE | Admit: 2011-12-20 | Discharge: 2011-12-21 | Disposition: A | Discharge status: Home / Self Care | Payer: PRIVATE HEALTH INSURANCE | Source: Ambulatory Visit | Attending: Gynecology | Admitting: Gynecology

## 2011-12-20 ENCOUNTER — Ambulatory Visit (HOSPITAL_COMMUNITY): Payer: PRIVATE HEALTH INSURANCE | Admitting: Anesthesiology

## 2011-12-20 DIAGNOSIS — D259 Leiomyoma of uterus, unspecified: Secondary | ICD-10-CM

## 2011-12-20 DIAGNOSIS — N926 Irregular menstruation, unspecified: Secondary | ICD-10-CM | POA: Insufficient documentation

## 2011-12-20 DIAGNOSIS — N803 Endometriosis of pelvic peritoneum, unspecified: Secondary | ICD-10-CM | POA: Insufficient documentation

## 2011-12-20 DIAGNOSIS — D251 Intramural leiomyoma of uterus: Secondary | ICD-10-CM | POA: Insufficient documentation

## 2011-12-20 DIAGNOSIS — N920 Excessive and frequent menstruation with regular cycle: Secondary | ICD-10-CM

## 2011-12-20 DIAGNOSIS — Z0000 Encounter for general adult medical examination without abnormal findings: Secondary | ICD-10-CM

## 2011-12-20 DIAGNOSIS — D279 Benign neoplasm of unspecified ovary: Secondary | ICD-10-CM | POA: Insufficient documentation

## 2011-12-20 DIAGNOSIS — N8000 Endometriosis of the uterus, unspecified: Secondary | ICD-10-CM | POA: Insufficient documentation

## 2011-12-20 DIAGNOSIS — D252 Subserosal leiomyoma of uterus: Secondary | ICD-10-CM | POA: Insufficient documentation

## 2011-12-20 HISTORY — PX: LAPAROSCOPIC ASSISTED VAGINAL HYSTERECTOMY: SHX5398

## 2011-12-20 LAB — HCG, SERUM, QUALITATIVE: Preg, Serum: NEGATIVE

## 2011-12-20 SURGERY — HYSTERECTOMY, VAGINAL, LAPAROSCOPY-ASSISTED
Anesthesia: General | Site: Abdomen | Wound class: Clean Contaminated

## 2011-12-20 MED ORDER — KETOROLAC TROMETHAMINE 30 MG/ML IJ SOLN
INTRAMUSCULAR | Status: DC | PRN
  Administered 2011-12-20: 30 mg via INTRAVENOUS

## 2011-12-20 MED ORDER — HYDROMORPHONE HCL PF 1 MG/ML IJ SOLN
INTRAMUSCULAR | Status: AC
  Administered 2011-12-20: 0.5 mg via INTRAVENOUS
  Filled 2011-12-20: qty 1

## 2011-12-20 MED ORDER — PROMETHAZINE HCL 25 MG/ML IJ SOLN: 6.2500 mg | INTRAMUSCULAR | Status: DC | PRN

## 2011-12-20 MED ORDER — MIDAZOLAM HCL 5 MG/5ML IJ SOLN
INTRAMUSCULAR | Status: DC | PRN
  Administered 2011-12-20: 2 mg via INTRAVENOUS

## 2011-12-20 MED ORDER — SCOPOLAMINE 1 MG/3DAYS TD PT72
1.0000 | MEDICATED_PATCH | TRANSDERMAL | Status: DC
  Administered 2011-12-20: 1.5 mg via TRANSDERMAL

## 2011-12-20 MED ORDER — ONDANSETRON HCL 4 MG PO TABS: 4.0000 mg | ORAL_TABLET | Freq: Four times a day (QID) | ORAL | Status: DC | PRN

## 2011-12-20 MED ORDER — DEXTROSE-NACL 5-0.9 % IV SOLN: INTRAVENOUS | Status: DC

## 2011-12-20 MED ORDER — KETOROLAC TROMETHAMINE 30 MG/ML IJ SOLN
30.0000 mg | Freq: Four times a day (QID) | INTRAMUSCULAR | Status: DC
  Administered 2011-12-20: 30 mg via INTRAVENOUS
  Filled 2011-12-20 (×2): qty 1

## 2011-12-20 MED ORDER — PROPOFOL 10 MG/ML IV EMUL
INTRAVENOUS | Status: AC
  Filled 2011-12-20: qty 20

## 2011-12-20 MED ORDER — DEXTROSE-NACL 5-0.9 % IV SOLN
INTRAVENOUS | Status: DC
  Administered 2011-12-20: 12:00:00 via INTRAVENOUS

## 2011-12-20 MED ORDER — NEOSTIGMINE METHYLSULFATE 1 MG/ML IJ SOLN
INTRAMUSCULAR | Status: DC | PRN
  Administered 2011-12-20: 3 mg via INTRAVENOUS

## 2011-12-20 MED ORDER — KETOROLAC TROMETHAMINE 30 MG/ML IJ SOLN
30.0000 mg | Freq: Four times a day (QID) | INTRAMUSCULAR | Status: DC
  Filled 2011-12-20: qty 1

## 2011-12-20 MED ORDER — GLYCOPYRROLATE 0.2 MG/ML IJ SOLN
INTRAMUSCULAR | Status: AC
  Filled 2011-12-20: qty 1

## 2011-12-20 MED ORDER — LIDOCAINE HCL (CARDIAC) 20 MG/ML IV SOLN
INTRAVENOUS | Status: DC | PRN
  Administered 2011-12-20: 50 mg via INTRAVENOUS

## 2011-12-20 MED ORDER — NEBIVOLOL HCL 5 MG PO TABS
5.0000 mg | ORAL_TABLET | Freq: Every day | ORAL | Status: DC
  Administered 2011-12-21: 5 mg via ORAL
  Filled 2011-12-20 (×2): qty 1

## 2011-12-20 MED ORDER — ONDANSETRON HCL 4 MG/2ML IJ SOLN
INTRAMUSCULAR | Status: AC
  Filled 2011-12-20: qty 2

## 2011-12-20 MED ORDER — HYDROMORPHONE HCL PF 1 MG/ML IJ SOLN
INTRAMUSCULAR | Status: DC | PRN
  Administered 2011-12-20: 1 mg via INTRAVENOUS

## 2011-12-20 MED ORDER — TETANUS-DIPHTH-ACELL PERTUSSIS 5-2.5-18.5 LF-MCG/0.5 IM SUSP: 0.5000 mL | Freq: Once | INTRAMUSCULAR | Status: DC

## 2011-12-20 MED ORDER — KETOROLAC TROMETHAMINE 30 MG/ML IJ SOLN: 15.0000 mg | Freq: Once | INTRAMUSCULAR | Status: DC | PRN

## 2011-12-20 MED ORDER — PANTOPRAZOLE SODIUM 40 MG PO TBEC
40.0000 mg | DELAYED_RELEASE_TABLET | Freq: Every day | ORAL | Status: DC
  Administered 2011-12-21: 40 mg via ORAL
  Filled 2011-12-20 (×2): qty 1

## 2011-12-20 MED ORDER — HYDROMORPHONE HCL PF 1 MG/ML IJ SOLN
INTRAMUSCULAR | Status: AC
  Filled 2011-12-20: qty 1

## 2011-12-20 MED ORDER — ZOLPIDEM TARTRATE 5 MG PO TABS: 5.0000 mg | ORAL_TABLET | Freq: Every evening | ORAL | Status: DC | PRN

## 2011-12-20 MED ORDER — MORPHINE SULFATE 4 MG/ML IJ SOLN
2.0000 mg | INTRAMUSCULAR | Status: DC | PRN
  Administered 2011-12-20 (×3): 2 mg via INTRAVENOUS
  Filled 2011-12-20 (×3): qty 1

## 2011-12-20 MED ORDER — MEPERIDINE HCL 25 MG/ML IJ SOLN: 6.2500 mg | INTRAMUSCULAR | Status: DC | PRN

## 2011-12-20 MED ORDER — GLYCOPYRROLATE 0.2 MG/ML IJ SOLN
INTRAMUSCULAR | Status: DC | PRN
  Administered 2011-12-20: .6 mg via INTRAVENOUS
  Administered 2011-12-20: 0.2 mg via INTRAVENOUS

## 2011-12-20 MED ORDER — MIDAZOLAM HCL 2 MG/2ML IJ SOLN
INTRAMUSCULAR | Status: AC
  Filled 2011-12-20: qty 2

## 2011-12-20 MED ORDER — OXYCODONE-ACETAMINOPHEN 5-325 MG PO TABS
1.0000 | ORAL_TABLET | ORAL | Status: DC | PRN
Start: 2011-12-20 — End: 2011-12-21
  Administered 2011-12-20: 2 via ORAL
  Administered 2011-12-20 (×2): 1 via ORAL
  Administered 2011-12-21 (×2): 2 via ORAL
  Filled 2011-12-20 (×2): qty 2
  Filled 2011-12-20: qty 1
  Filled 2011-12-20: qty 2
  Filled 2011-12-20: qty 1

## 2011-12-20 MED ORDER — INDIGOTINDISULFONATE SODIUM 8 MG/ML IJ SOLN
INTRAMUSCULAR | Status: AC
  Filled 2011-12-20: qty 5

## 2011-12-20 MED ORDER — ONDANSETRON HCL 4 MG/2ML IJ SOLN: 4.0000 mg | Freq: Four times a day (QID) | INTRAMUSCULAR | Status: DC | PRN

## 2011-12-20 MED ORDER — DEXAMETHASONE SODIUM PHOSPHATE 4 MG/ML IJ SOLN
INTRAMUSCULAR | Status: DC | PRN
  Administered 2011-12-20: 10 mg via INTRAVENOUS

## 2011-12-20 MED ORDER — NEOSTIGMINE METHYLSULFATE 1 MG/ML IJ SOLN
INTRAMUSCULAR | Status: AC
  Filled 2011-12-20: qty 10

## 2011-12-20 MED ORDER — BUPIVACAINE HCL (PF) 0.25 % IJ SOLN
INTRAMUSCULAR | Status: DC | PRN
  Administered 2011-12-20: 6 mL

## 2011-12-20 MED ORDER — LIDOCAINE HCL (CARDIAC) 20 MG/ML IV SOLN
INTRAVENOUS | Status: AC
  Filled 2011-12-20: qty 5

## 2011-12-20 MED ORDER — FENTANYL CITRATE 0.05 MG/ML IJ SOLN
INTRAMUSCULAR | Status: AC
  Filled 2011-12-20: qty 5

## 2011-12-20 MED ORDER — MIDAZOLAM HCL 2 MG/2ML IJ SOLN: 0.5000 mg | Freq: Once | INTRAMUSCULAR | Status: DC | PRN

## 2011-12-20 MED ORDER — FENTANYL CITRATE 0.05 MG/ML IJ SOLN
INTRAMUSCULAR | Status: DC | PRN
  Administered 2011-12-20: 50 ug via INTRAVENOUS
  Administered 2011-12-20 (×2): 100 ug via INTRAVENOUS

## 2011-12-20 MED ORDER — FENTANYL CITRATE 0.05 MG/ML IJ SOLN
INTRAMUSCULAR | Status: AC
  Administered 2011-12-20: 50 ug via INTRAVENOUS
  Filled 2011-12-20: qty 2

## 2011-12-20 MED ORDER — LIDOCAINE-EPINEPHRINE 1 %-1:100000 IJ SOLN
INTRAMUSCULAR | Status: DC | PRN
  Administered 2011-12-20: 10 mL

## 2011-12-20 MED ORDER — PROPOFOL 10 MG/ML IV EMUL
INTRAVENOUS | Status: DC | PRN
  Administered 2011-12-20: 150 mg via INTRAVENOUS

## 2011-12-20 MED ORDER — FENTANYL CITRATE 0.05 MG/ML IJ SOLN
25.0000 ug | INTRAMUSCULAR | Status: DC | PRN
  Administered 2011-12-20 (×2): 50 ug via INTRAVENOUS

## 2011-12-20 MED ORDER — ONDANSETRON HCL 4 MG/2ML IJ SOLN
INTRAMUSCULAR | Status: DC | PRN
  Administered 2011-12-20: 4 mg via INTRAVENOUS

## 2011-12-20 MED ORDER — DIPHENHYDRAMINE HCL 25 MG PO CAPS: 50.0000 mg | ORAL_CAPSULE | Freq: Four times a day (QID) | ORAL | Status: DC | PRN

## 2011-12-20 MED ORDER — SCOPOLAMINE 1 MG/3DAYS TD PT72
MEDICATED_PATCH | TRANSDERMAL | Status: AC
  Administered 2011-12-20: 1.5 mg via TRANSDERMAL
  Filled 2011-12-20: qty 1

## 2011-12-20 MED ORDER — LACTATED RINGERS IR SOLN
Status: DC | PRN
  Administered 2011-12-20: 3000 mL

## 2011-12-20 MED ORDER — PRAZIQUANTEL 600 MG PO TABS: 600.0000 mg | ORAL_TABLET | Freq: Every day | ORAL | Status: DC

## 2011-12-20 MED ORDER — BUPIVACAINE HCL (PF) 0.25 % IJ SOLN
INTRAMUSCULAR | Status: AC
  Filled 2011-12-20: qty 30

## 2011-12-20 MED ORDER — LACTATED RINGERS IV SOLN
INTRAVENOUS | Status: DC
  Administered 2011-12-20: 125 mL/h via INTRAVENOUS
  Administered 2011-12-20: 07:00:00 via INTRAVENOUS

## 2011-12-20 MED ORDER — PNEUMOCOCCAL VAC POLYVALENT 25 MCG/0.5ML IJ INJ
0.5000 mL | INJECTION | INTRAMUSCULAR | Status: AC
  Administered 2011-12-21: 0.5 mL via INTRAMUSCULAR
  Filled 2011-12-20: qty 0.5

## 2011-12-20 MED ORDER — ROCURONIUM BROMIDE 100 MG/10ML IV SOLN
INTRAVENOUS | Status: DC | PRN
  Administered 2011-12-20 (×2): 10 mg via INTRAVENOUS
  Administered 2011-12-20: 5 mg via INTRAVENOUS
  Administered 2011-12-20: 35 mg via INTRAVENOUS

## 2011-12-20 MED ORDER — HYDROMORPHONE HCL PF 1 MG/ML IJ SOLN
0.2500 mg | INTRAMUSCULAR | Status: DC | PRN
  Administered 2011-12-20 (×3): 0.5 mg via INTRAVENOUS

## 2011-12-20 MED ORDER — KETOROLAC TROMETHAMINE 30 MG/ML IJ SOLN
INTRAMUSCULAR | Status: AC
  Filled 2011-12-20: qty 1

## 2011-12-20 MED ORDER — DEXAMETHASONE SODIUM PHOSPHATE 10 MG/ML IJ SOLN
INTRAMUSCULAR | Status: AC
  Filled 2011-12-20: qty 1

## 2011-12-20 SURGICAL SUPPLY — 70 items
ADH SKN CLS APL DERMABOND .7 (GAUZE/BANDAGES/DRESSINGS) ×2
BLADE SURG 15 STRL LF C SS BP (BLADE) ×2 IMPLANT
BLADE SURG 15 STRL SS (BLADE) ×3
CABLE HIGH FREQUENCY MONO STRZ (ELECTRODE) IMPLANT
CLOTH BEACON ORANGE TIMEOUT ST (SAFETY) ×3 IMPLANT
CONT PATH 16OZ SNAP LID 3702 (MISCELLANEOUS) ×3 IMPLANT
COVER MAYO STAND STRL (DRAPES) ×3 IMPLANT
COVER TABLE BACK 60X90 (DRAPES) ×3 IMPLANT
DECANTER SPIKE VIAL GLASS SM (MISCELLANEOUS) ×2 IMPLANT
DERMABOND ADVANCED (GAUZE/BANDAGES/DRESSINGS) ×1
DERMABOND ADVANCED .7 DNX12 (GAUZE/BANDAGES/DRESSINGS) ×2 IMPLANT
DEVICE SUTURE ENDOST 10MM (ENDOMECHANICALS) IMPLANT
DISSECTOR BLUNT TIP ENDO 5MM (MISCELLANEOUS) IMPLANT
DRAPE PROXIMA HALF (DRAPES) ×4 IMPLANT
DRAPE UTILITY XL STRL (DRAPES) ×3 IMPLANT
ELECT REM PT RETURN 9FT ADLT (ELECTROSURGICAL) ×3
ELECTRODE REM PT RTRN 9FT ADLT (ELECTROSURGICAL) ×1 IMPLANT
ENDOSTITCH 0 SINGLE 48 (SUTURE) IMPLANT
EVACUATOR SMOKE 8.L (FILTER) ×4 IMPLANT
GAUZE SPONGE 4X4 16PLY XRAY LF (GAUZE/BANDAGES/DRESSINGS) ×2 IMPLANT
GLOVE BIO SURGEON STRL SZ7.5 (GLOVE) ×3 IMPLANT
GLOVE BIOGEL PI IND STRL 6.5 (GLOVE) ×1 IMPLANT
GLOVE BIOGEL PI IND STRL 8 (GLOVE) ×2 IMPLANT
GLOVE BIOGEL PI INDICATOR 6.5 (GLOVE) ×1
GLOVE BIOGEL PI INDICATOR 8 (GLOVE) ×1
GLOVE ECLIPSE 7.5 STRL STRAW (GLOVE) ×2 IMPLANT
GOWN PREVENTION PLUS LG XLONG (DISPOSABLE) ×12 IMPLANT
HARMONIC RUM II 2.5CM SILVER (DISPOSABLE)
HARMONIC RUM II 3.0CM SILVER (DISPOSABLE)
HARMONIC RUM II 3.5CM SILVER (DISPOSABLE)
HARMONIC RUM II 4.0CM SILVER (DISPOSABLE)
NDL SPNL 18GX3.5 QUINCKE PK (NEEDLE) ×1 IMPLANT
NEEDLE MAYO .5 CIRCLE (NEEDLE) ×2 IMPLANT
NEEDLE SPNL 18GX3.5 QUINCKE PK (NEEDLE) ×3 IMPLANT
NS IRRIG 1000ML POUR BTL (IV SOLUTION) ×3 IMPLANT
PACK LAVH (CUSTOM PROCEDURE TRAY) ×3 IMPLANT
SCALPEL HARMONIC ACE (MISCELLANEOUS) ×3 IMPLANT
SCALPEL HRMNC RUM II 2.5 SILVR (DISPOSABLE) IMPLANT
SCALPEL HRMNC RUM II 3.0 SILVR (DISPOSABLE) IMPLANT
SCALPEL HRMNC RUM II 3.5 SILVR (DISPOSABLE) IMPLANT
SCALPEL HRMNC RUM II 4.0 SILVR (DISPOSABLE) IMPLANT
SCISSORS LAP 5X35 DISP (ENDOMECHANICALS) IMPLANT
SET CYSTO W/LG BORE CLAMP LF (SET/KITS/TRAYS/PACK) IMPLANT
SET IRRIG TUBING LAPAROSCOPIC (IRRIGATION / IRRIGATOR) ×3 IMPLANT
SLEEVE ADV FIXATION 5X100MM (TROCAR) ×2 IMPLANT
SOLUTION ELECTROLUBE (MISCELLANEOUS) IMPLANT
STRIP CLOSURE SKIN 1/4X3 (GAUZE/BANDAGES/DRESSINGS) IMPLANT
SUT PLAIN 4 0 FS 2 27 (SUTURE) ×6 IMPLANT
SUT VIC AB 0 CT1 18XCR BRD8 (SUTURE) ×5 IMPLANT
SUT VIC AB 0 CT1 27 (SUTURE) ×3
SUT VIC AB 0 CT1 27XBRD ANBCTR (SUTURE) ×2 IMPLANT
SUT VIC AB 0 CT1 8-18 (SUTURE) ×6
SUT VIC AB 2-0 SH 27 (SUTURE) ×3
SUT VIC AB 2-0 SH 27XBRD (SUTURE) ×2 IMPLANT
SUT VICRYL 0 TIES 12 18 (SUTURE) ×3 IMPLANT
SUT VICRYL 0 UR6 27IN ABS (SUTURE) ×3 IMPLANT
SYR 50ML LL SCALE MARK (SYRINGE) ×3 IMPLANT
SYR BULB IRRIGATION 50ML (SYRINGE) ×3 IMPLANT
TIP UTERINE 5.1X6CM LAV DISP (MISCELLANEOUS) IMPLANT
TIP UTERINE 6.7X10CM GRN DISP (MISCELLANEOUS) IMPLANT
TIP UTERINE 6.7X6CM WHT DISP (MISCELLANEOUS) IMPLANT
TIP UTERINE 6.7X8CM BLUE DISP (MISCELLANEOUS) ×2 IMPLANT
TOWEL OR 17X24 6PK STRL BLUE (TOWEL DISPOSABLE) ×6 IMPLANT
TRAY FOLEY CATH 14FR (SET/KITS/TRAYS/PACK) ×3 IMPLANT
TROCAR BALLN 12MMX100 BLUNT (TROCAR) IMPLANT
TROCAR XCEL NON-BLD 11X100MML (ENDOMECHANICALS) ×6 IMPLANT
TROCAR XCEL NON-BLD 5MMX100MML (ENDOMECHANICALS) ×6 IMPLANT
TROCAR Z-THREAD FIOS 5X100MM (TROCAR) IMPLANT
WARMER LAPAROSCOPE (MISCELLANEOUS) ×3 IMPLANT
WATER STERILE IRR 1000ML POUR (IV SOLUTION) ×3 IMPLANT

## 2011-12-20 NOTE — Anesthesia Preprocedure Evaluation (Signed)
Anesthesia Evaluation  Patient identified by MRN, date of birth, ID band Patient awake    Reviewed: Allergy & Precautions, H&P , Patient's Chart, lab work & pertinent test results, reviewed documented beta blocker date and time   History of Anesthesia Complications (+) POST - OP SPINAL HEADACHE  Airway Mallampati: II TM Distance: >3 FB Neck ROM: full    Dental No notable dental hx.    Pulmonary neg pulmonary ROS, asthma , pneumonia -, resolved,  breath sounds clear to auscultation  Pulmonary exam normal       Cardiovascular Exercise Tolerance: Good hypertension, negative cardio ROS  + dysrhythmias Rhythm:regular Rate:Normal     Neuro/Psych  Headaches, PSYCHIATRIC DISORDERS Depression  Neuromuscular disease negative neurological ROS  negative psych ROS   GI/Hepatic negative GI ROS, Neg liver ROS, GERD-  Controlled,  Endo/Other  negative endocrine ROS  Renal/GU negative Renal ROS     Musculoskeletal  (+) Fibromyalgia -  Abdominal   Peds  Hematology negative hematology ROS (+)   Anesthesia Other Findings Spinal headache 1990 BP dropped after spinal anes. Depression        GERD (gastroesophageal reflux disease)     Urinary incontinence, stress        Hypertension     Anal fissure        C. difficile diarrhea     Hyperlipidemia        Pancreatitis     Pneumonia        Asthma   no inhaler Dysrhythmia   tachycardia -controlled by med    Fibromyalgia    Reproductive/Obstetrics negative OB ROS                           Anesthesia Physical Anesthesia Plan  ASA: III  Anesthesia Plan: General ETT   Post-op Pain Management:    Induction:   Airway Management Planned:   Additional Equipment:   Intra-op Plan:   Post-operative Plan:   Informed Consent: I have reviewed the patients History and Physical, chart, labs and discussed the procedure including the risks, benefits and alternatives  for the proposed anesthesia with the patient or authorized representative who has indicated his/her understanding and acceptance.   Dental Advisory Given  Plan Discussed with: CRNA and Surgeon  Anesthesia Plan Comments:         Anesthesia Quick Evaluation

## 2011-12-20 NOTE — Progress Notes (Signed)
Patient ID: Rose Robertson, female   DOB: 30-Apr-1960, 51 y.o.   MRN: [id number] DOS LAVH/BSO  Doing well Awake, alert Abd soft, dressing dry Results of surgery reviewed with patient and husband Routine PO care, tentative D/C in am

## 2011-12-20 NOTE — H&P (Signed)
  The patient was examined.  I reviewed the proposed surgery and consent form with the patient.  The patient has decided to have both ovaries removed as noted in her addended H&P.  She understands the issues as we have discussed this previously and again today.  The dictated history and physical is current and accurate and all questions were answered. The patient is ready to proceed with surgery and has a realistic understanding and expectation for the outcome.   Dara Lords MD, 7:06 AM 12/20/2011

## 2011-12-20 NOTE — Addendum Note (Signed)
Addendum  created 12/20/11 1746 by Graciela Husbands, CRNA   Modules edited:Notes Section

## 2011-12-20 NOTE — Anesthesia Postprocedure Evaluation (Signed)
  Anesthesia Post-op Note  Patient: Rose Robertson  Procedure(s) Performed: Procedure(s) (LRB) with comments: LAPAROSCOPIC ASSISTED VAGINAL HYSTERECTOMY (N/A)  Patient Location: [facility]  Anesthesia Type: General  Level of Consciousness: awake, alert  and oriented  Airway and Oxygen Therapy: Patient Spontanous Breathing and Patient connected to nasal cannula oxygen  Post-op Pain: mild  Post-op Assessment: Post-op Vital signs reviewed and Patient's Cardiovascular Status Stable  Post-op Vital Signs: Reviewed and stable  Complications: No apparent anesthesia complications

## 2011-12-20 NOTE — Op Note (Signed)
Rose Robertson 10-28-1960 [id number]   Post Operative Note   Date of surgery:  12/20/2011  Pre Op Dx:  Menorrhagia, irregular menses, leiomyoma, right ovarian calcified area questionable dermoid  Post Op Dx:  Menorrhagia, irregular menses, leiomyoma, abdominal adhesions, endometriosis, right ovarian calcified area questionable dermoid  Procedure:  Laparoscopic assisted vaginal hysterectomy, bilateral salpingo-oophorectomy, lysis of adhesions, fulguration endometriosis  Surgeon:  Dara Lords  Assistant:  Reynaldo Minium  Anesthesia:  General  EBL:  150 cc anesthesia reported  Complications:  None  Specimen:  Uterus, morcellated clinical weight 346 g, right and left fallopian tubes and ovaries to pathology  Findings: EUA:  External BUS vagina normal. Cervix normal. Uterus retroverted 8 weeks size irregular. Adnexa without gross masses   Operative:  Anterior cul-de-sac normal. Posterior cul-de-sac with classic powder burn endometriotic implant right sidewall inferior to course of the ureter, bipolar cauterized. Uterus enlarged retroverted irregular with multiple small myomas. Right and left fallopian tubes segments normal. Right left ovaries grossly normal. Periumbilical/omental adhesions, lysed.  Liver smooth no abnormalities.  Gallbladder not visualized. Appendix not visualized.  Procedure:  Patient was taken to the operating room, underwent general anesthesia, was placed in the low dorsal lithotomy position, received an abdominal/perineal/vaginal preparation with Betadine solution, EUA performed, Foley catheter placed in sterile technique and the patient was draped in the usual fashion. A time out was performed by the surgical team. The cervix was visualized with a speculum and a RUMI uterine manipulator was placed without difficulty.  A transverse infraumbilical incision was made and using the Optiview 10 mm direct entry trocar the abdomen was entered under direct  visualization without difficulty and subsequently insufflated. Right and left 5 mm suprapubic ports were then placed under direct visualization after transillumination for the vessels. Examination of the pelvic organs and upper abdominal exam was carried out with findings noted above. Right and left ureters were identified and traced in their course and found to be away from the operative field. The The right infundibulopelvic ligament and vessels were identified and transected using the harmonic scalpel. The right broad ligament and round ligament were also transected using the harmonic scalpel. A similar procedure was carried out on the other side. The vesicouterine peritoneal fold was then transected using the harmonic scalpel and the bladder flap was sharply and bluntly developed without difficulty. A 5 mm laparoscope was then placed through the right suprapubic port and using the harmonic scalpel through the left 5 mm suprapubic port the periumbilical adhesions were then lysed completely. Attention was then turned to the vaginal portion of the procedure the patient was placed in the high dorsal lithotomy position, the cervix visualized with a weighted speculum, grasped with a tenaculum after removal of the RUMI uterine manipulator and the paracervical mucosa was injected using lidocaine/epinephrine mixture, a total of 10 cc. The cervical mucosa was then circumferentially incised and the paracervical planes were sharply developed. The posterior cul-de-sac was sharply entered and a long weighted speculum placed. The right and left uterosacral ligaments were then identified, clamped cut and ligated using 0 Vicryl suture and tagged for future reference. The anterior cul-de-sac was sharply and bluntly developed and ultimately entered without difficulty and the uterus was progressively freed from its attachments to clamping cutting and ligating of the cardinal ligaments, parametrial tissues using 0 Vicryl suture. Due  to the bulk of the uterus it became evident that morselization would improve visualization and the uterus was progressively morcellated and ultimately excised in its entirety. The long weighted  speculum was replaced with a shorter weighted speculum, the intestines packed from the operative field with a tagged tail sponge and the posterior vaginal cuff was run from uterosacral ligament to uterosacral ligament using 0 Vicryl suture in a running interlocking stitch. The tail sponge was removed, the cul-de-sac irrigated showing adequate hemostasis and the vagina was closed anterior to posterior using 0 Vicryl suture in interrupted figure-of-eight stitch. After regloving the surgical team the patient was placed in the low dorsal lithotomy position and the abdomen was reinsufflated and the pelvis was reinspected after copious irrigation. A small area of classic endometriosis was identified in the lower right pelvic sidewall below the course of the ureter and this was superficially bipolar cauterized.The pressure was slowly allowed to escape and several small bleeding points along the vaginal cuff were dressed using bipolar cautery to achieve ultimate hemostasis. The 5 mm suprapubic ports were removed, hemostasis visualized under low pressure situation and the infraumbilical port was then backed out under direct visualization showing adequate hemostasis and no evidence of hernia formation. All skin incisions were injected using 0.25% Marcaine. The umbilical port closed using 0 Vicryl suture in a interrupted subcutaneous/fascial stitch and all skin incisions closed using 4-0 plain suture in interrupted cuticular stitch. Sterile dressings were applied. The patient received intraoperative Toradol, placed in the supine position, awakened without difficulty and taken to the recovery room in good condition having tolerated the procedure well.    Dara Lords MD, 9:56 AM 12/20/2011

## 2011-12-20 NOTE — Anesthesia Postprocedure Evaluation (Signed)
  Anesthesia Post-op Note  Patient: Rose Robertson  Procedure(s) Performed: Procedure(s) (LRB) with comments: LAPAROSCOPIC ASSISTED VAGINAL HYSTERECTOMY (N/A) Patient is awake and responsive. Pain and nausea are reasonably well controlled. Vital signs are stable and clinically acceptable. Oxygen saturation is clinically acceptable. There are no apparent anesthetic complications at this time. Patient is ready for discharge.

## 2011-12-20 NOTE — Anesthesia Procedure Notes (Signed)
Procedure Name: Intubation Date/Time: 12/20/2011 7:46 AM Performed by: Isabella Bowens R Pre-anesthesia Checklist: Patient identified, Emergency Drugs available, Suction available, Patient being monitored and Timeout performed Patient Re-evaluated:Patient Re-evaluated prior to inductionOxygen Delivery Method: Circle system utilized Preoxygenation: Pre-oxygenation with 100% oxygen Intubation Type: IV induction Ventilation: Mask ventilation with difficulty and Oral airway inserted - appropriate to patient size Laryngoscope Size: Mac and 3 Grade View: Grade II Tube type: Oral Number of attempts: 2 Airway Equipment and Method: Video-laryngoscopy Placement Confirmation: ETT inserted through vocal cords under direct vision,  positive ETCO2 and breath sounds checked- equal and bilateral Secured at: 20 cm Tube secured with: Tape Dental Injury: Teeth and Oropharynx as per pre-operative assessment  Difficulty Due To: Difficulty was anticipated and Difficult Airway- due to anterior larynx

## 2011-12-20 NOTE — Transfer of Care (Signed)
Immediate Anesthesia Transfer of Care Note  Patient: Rose Robertson  Procedure(s) Performed: Procedure(s) (LRB) with comments: LAPAROSCOPIC ASSISTED VAGINAL HYSTERECTOMY (N/A)  Patient Location: PACU  Anesthesia Type: General  Level of Consciousness: awake, oriented and patient cooperative  Airway & Oxygen Therapy: Patient Spontanous Breathing and Patient connected to nasal cannula oxygen  Post-op Assessment: Report given to PACU RN and Post -op Vital signs reviewed and stable  Post vital signs: Reviewed and stable  Complications: No apparent anesthesia complications

## 2011-12-21 ENCOUNTER — Telehealth: Payer: Self-pay | Admitting: *Deleted

## 2011-12-21 ENCOUNTER — Encounter (HOSPITAL_COMMUNITY): Payer: Self-pay | Admitting: Gynecology

## 2011-12-21 MED ORDER — OXYCODONE-ACETAMINOPHEN 5-325 MG PO TABS: 1.0000 | ORAL_TABLET | ORAL | Status: DC | PRN

## 2011-12-21 MED ORDER — PNEUMOCOCCAL VAC POLYVALENT 25 MCG/0.5ML IJ INJ: 0.5000 mL | INJECTION | INTRAMUSCULAR | Status: DC

## 2011-12-21 NOTE — Progress Notes (Signed)
Pt discharged to home with husband.  Condition stable.  Pt to car via wheelchair with Aron Baba, RN.  No equipment for home ordered at discharge.

## 2011-12-21 NOTE — Discharge Summary (Signed)
Rose Robertson December 02, 1960 [id number]   Discharge Summary  Date of Admission:  12/20/2011  Date of Discharge:  12/21/2011  Discharge Diagnosis:  Leiomyoma, adenomyosis, right ovarian mature cystic teratoma, peritoneal endometriosis, abdominal adhesions  Procedure:  Procedure(s): LAPAROSCOPIC ASSISTED VAGINAL HYSTERECTOMY, bilateral salpingo-oophorectomy, lysis of adhesions, fulguration endometriosis  Pathology: Accession #: [id number] Diagnosis 1. Ovary and fallopian tube, right - MATURE CYSTIC TERATOMA, NO ATYPIA OR MALIGNANCY. - BENIGN FALLOPIAN TUBAL TISSUE; NO PATHOLOGIC ABNORMALITIES. 2. Uterus and cervix, with left fallopian tube and ovary - LEIOMYOMATA. - ADENOMYOSIS. - ENDOMETRIUM: BENIGN SECRETORY ENDOMETRIUM, NO ATYPIA, HYPERPLASIA OR MALIGNANCY. - CERVIX: BENIGN SQUAMOUS MUCOSA AND ENDOCERVICAL MUCOSA; NO DYSPLASIA OR MALIGNANCY. - LEFT OVARY: BENIGN OVARIAN TISSUE, NO EVIDENCE OF ENDOMETRIOSIS, ATYPIA OR MALIGNANCY. - LEFT FALLOPIAN TUBE: BENIGN FALLOPIAN TUBAL TISSUE, NO PATHOLOGIC ABNORMALITIES.  Hospital Course:  The patient underwent the above surgery without difficulty. Her postoperative course was uncomplicated and she was discharged on postoperative day #1 ambulating well, voiding without difficulty, eating regular food with good pain relief. Patient received precautions, instructions and follow up and will be seen in the office 2 weeks following discharge. She received prescriptions for oxycodone/acetaminophen 5/325 #30 one to 2 by mouth every 4 hours when necessary pain.    Dara Lords MD, 10:56 AM 12/21/2011

## 2011-12-21 NOTE — Telephone Encounter (Signed)
Message copied by Aura Camps on Tue Dec 21, 2011 10:55 AM ------      Message from: Dara Lords      Created: Tue Dec 21, 2011  8:53 AM       Ask pathology to do a fungal stain on a representative slide from her hysterectomy specimen. Patient is convinced that she has systemic candidiasis and that this is a cause for her bleeding. She is not going to be reassured unless they do a stain for fungus.

## 2011-12-21 NOTE — Progress Notes (Signed)
Rose Robertson [birth year] [id number]   1 Day Post-Op Procedure(s) (LRB): LAPAROSCOPIC ASSISTED VAGINAL HYSTERECTOMY (N/A)  Subjective: Patient reports feels well, pain severity reported mild, yes taking PO, foley catheter out, yes voiding, yes ambulating, yes passing flatus  Objective: Afeb, VSS   EXAM General: awake, alert and no distress Resp: rhonchi clear to auscultation bilaterally Cardio: regular rate and rhythm, S1, S2 normal, no murmur, click, rub or gallop GI: soft, non tender, bowel sounds active, incisions dry intact Lower Extremities: Without swelling or tenderness Vaginal Bleeding: Reported scant  Assessment: s/p Procedure(s): LAPAROSCOPIC ASSISTED VAGINAL HYSTERECTOMY: progressing well, ready for discharge.  Eating, voiding, ambulating, minimal pain.  Plan: Discharge home today.  Precautions, instructions and follow up were discussed with the patient.  Prescriptions provided per AVS.  Patient to call the office to arrange a post-operative appointmant in 2 weeks.  Dara Lords, MD 12/21/2011 8:31 AM

## 2011-12-21 NOTE — Telephone Encounter (Signed)
Called and spoke with Dr.Lee and she will have fungal stain of slide for uterus done per TF request.

## 2011-12-27 ENCOUNTER — Ambulatory Visit (INDEPENDENT_AMBULATORY_CARE_PROVIDER_SITE_OTHER): Payer: PRIVATE HEALTH INSURANCE | Admitting: Gynecology

## 2011-12-27 ENCOUNTER — Encounter: Payer: Self-pay | Admitting: Gynecology

## 2011-12-27 VITALS — Temp 99.4°F

## 2011-12-27 DIAGNOSIS — Z9889 Other specified postprocedural states: Secondary | ICD-10-CM

## 2011-12-27 DIAGNOSIS — R3915 Urgency of urination: Secondary | ICD-10-CM

## 2011-12-27 LAB — URINALYSIS W MICROSCOPIC + REFLEX CULTURE
Bilirubin Urine: NEGATIVE
Casts: NONE SEEN
Crystals: NONE SEEN
Glucose, UA: NEGATIVE mg/dL
Ketones, ur: NEGATIVE mg/dL
Leukocytes, UA: NEGATIVE
Nitrite: NEGATIVE
Protein, ur: NEGATIVE mg/dL
Specific Gravity, Urine: 1.01 (ref 1.005–1.030)
Urobilinogen, UA: 0.2 mg/dL (ref 0.0–1.0)
WBC, UA: NONE SEEN WBC/hpf (ref <3)
pH: 7 (ref 5.0–8.0)

## 2011-12-27 MED ORDER — IBUPROFEN 800 MG PO TABS: 800.0000 mg | ORAL_TABLET | Freq: Three times a day (TID) | ORAL | Status: DC | PRN

## 2011-12-27 MED ORDER — OXYCODONE-ACETAMINOPHEN 5-325 MG PO TABS: 1.0000 | ORAL_TABLET | ORAL | Status: DC | PRN

## 2011-12-27 NOTE — Progress Notes (Signed)
Patient presents one week status post LAVH BSO complaining of feeling like she's having a hormonal crash. She is on homeopathic estrogen/progesterone has been using up to 4 pumps of estrogen daily and does not feel like she's getting adequate relief. Also noted some diarrhea and pelvic discomfort particularly with bowel movement. No history of fevers. Has some nausea particularly with pain. No vomiting. Drinking freely without issues and voiding without difficulty. Eating some but without appetite.  Exam was Claudean Severance Temperature 99.4 HEENT normal. Lungs clear Cardiac regular rate no rubs murmurs or gallops Abdomen soft nontender without masses guarding rebound organomegaly with active bowel sounds throughout. Incisions red, reacting to the sutures. All sutures were removed. Incisions otherwise healing nicely and intact. Pelvic cuff intact. Bimanual without masses or mild tenderness at cuff rectovaginal exam without masses or significant tenderness.  Assessment and plan: One week postop status post LAVH BSO. Reviewed pathology findings which showed leiomyoma adenomyosis and a mature cystic teratoma in the right ovary. Patient feeling like she's having hormonal symptoms. We'll try Vivelle 0.1 mg patch for now until she can discuss with her other provider who is managing her HRT. Some mild diarrhea. She does have a history of C. Difficile although she does not feel like it's the same. Is able to drink freely and is eating some food but has a lack of appetite. UA is negative. Possible small cuff hematoma accounting for some tenderness. No overt indications for infection or more significant organ damage such as bowel or bladder.  Refilled her oxycodone 5 mg/325 #30 and ibuprofen 800 mg #31 refill by mouth every 8 hour when necessary pain ASAP call precautions reviewed and we'll plan monitoring at present.

## 2011-12-27 NOTE — Patient Instructions (Addendum)
Take pain medication as prescribed. Call if any fevers, worsening pain or any other symptoms that are concerning.  Follow up at postop appointment in one week.

## 2012-01-03 ENCOUNTER — Encounter: Payer: Self-pay | Admitting: Gynecology

## 2012-01-03 ENCOUNTER — Ambulatory Visit (INDEPENDENT_AMBULATORY_CARE_PROVIDER_SITE_OTHER): Payer: PRIVATE HEALTH INSURANCE | Admitting: Gynecology

## 2012-01-03 DIAGNOSIS — R109 Unspecified abdominal pain: Secondary | ICD-10-CM

## 2012-01-03 DIAGNOSIS — Z09 Encounter for follow-up examination after completed treatment for conditions other than malignant neoplasm: Secondary | ICD-10-CM

## 2012-01-03 MED ORDER — ESTRADIOL 0.1 MG/24HR TD PTTW: 1.0000 | MEDICATED_PATCH | TRANSDERMAL | Status: DC

## 2012-01-03 MED ORDER — OXYCODONE-ACETAMINOPHEN 5-325 MG PO TABS: 1.0000 | ORAL_TABLET | ORAL | Status: DC | PRN

## 2012-01-03 NOTE — Progress Notes (Signed)
Patient presents to be postoperative status post LAVH BSO. Feeling much better than the last time I saw her on Vivelle 0.1 mg patches. Having some right lower quadrant discomfort comes and goes with activity. She is walking eating voiding. Still having some diarrhea although usually just once daily. No fever chills nausea vomiting.  Exam was kim assistant Abdomen soft nontender without masses guarding rebound organomegaly. Incisions healing nicely. Pelvic external BUS vagina with cuff healing nicely. Bimanual without masses or significant tenderness. Rectovaginal exam without masses or significant tenderness.  Assessment and plan: Normal postop check at 2 weeks. Having some right low quadrant discomfort and diarrhea. We'll go ahead and check C. Difficile given her past history of this as well as a urinalysis. Refilled her oxycodone 5/325 #30 for pain as needed. Slowly resume activities with the exception of pelvic rest. Follow up in the next week before she plans on leaving the country.  I again reviewed findings of pathology and pictures with her.

## 2012-01-03 NOTE — Patient Instructions (Signed)
Follow-up in 2 weeks

## 2012-01-04 LAB — URINALYSIS W MICROSCOPIC + REFLEX CULTURE
Bacteria, UA: NONE SEEN
Bilirubin Urine: NEGATIVE
Casts: NONE SEEN
Crystals: NONE SEEN
Glucose, UA: NEGATIVE mg/dL
Hgb urine dipstick: NEGATIVE
Ketones, ur: NEGATIVE mg/dL
Leukocytes, UA: NEGATIVE
Nitrite: NEGATIVE
Protein, ur: NEGATIVE mg/dL
Specific Gravity, Urine: 1.016 (ref 1.005–1.030)
Squamous Epithelial / LPF: NONE SEEN
Urobilinogen, UA: 0.2 mg/dL (ref 0.0–1.0)
pH: 5.5 (ref 5.0–8.0)

## 2012-01-13 ENCOUNTER — Ambulatory Visit: Payer: PRIVATE HEALTH INSURANCE | Admitting: Gynecology

## 2012-01-13 ENCOUNTER — Ambulatory Visit (INDEPENDENT_AMBULATORY_CARE_PROVIDER_SITE_OTHER): Payer: PRIVATE HEALTH INSURANCE

## 2012-01-13 ENCOUNTER — Encounter: Payer: Self-pay | Admitting: Gynecology

## 2012-01-13 ENCOUNTER — Ambulatory Visit (INDEPENDENT_AMBULATORY_CARE_PROVIDER_SITE_OTHER): Payer: PRIVATE HEALTH INSURANCE | Admitting: Gynecology

## 2012-01-13 VITALS — BP 124/78

## 2012-01-13 DIAGNOSIS — R109 Unspecified abdominal pain: Secondary | ICD-10-CM

## 2012-01-13 DIAGNOSIS — Z9889 Other specified postprocedural states: Secondary | ICD-10-CM

## 2012-01-13 DIAGNOSIS — R102 Pelvic and perineal pain: Secondary | ICD-10-CM

## 2012-01-13 DIAGNOSIS — N949 Unspecified condition associated with female genital organs and menstrual cycle: Secondary | ICD-10-CM

## 2012-01-13 DIAGNOSIS — R1031 Right lower quadrant pain: Secondary | ICD-10-CM

## 2012-01-13 NOTE — Progress Notes (Addendum)
Patient presents postop status post LAVH BSO 12/20/2011. She's done well although has some nagging right lower quadrant discomfort. She is eating drinking voiding and having regular bowel movements. She was having some diarrhea previously and I ordered a C. Difficile screen, but she never did this and said now that her diarrhea has resolved.  Exam with Blanca Asst. Abdomen soft nontender without masses guarding rebound organomegaly. Incisions healed nicely. Pelvic external BUS vagina normal with cuff healed nicely. No masses or tenderness on bimanual. Rectovaginal exam is normal.  Assessment and plan: Postop check status post LAVH BSO 12/20/2011. She has seen Suanne Marker in follow up of HRT management and is being taken care of by her for this. She is concerned about the right lower quadrant discomfort and is leaving the country for 6 months in several days and asked if we could do an ultrasound just to make sure everything is okay. Although her exam is normal, I do not feel evidence of a cuff hematoma we will check a baseline ultrasound just to make sure. Otherwise she'll resume all normal activities and follow up in 6 months for a checkup.  Addendum: Patient subsequently had ultrasound both transabdominal and transvaginal which showed no evidence of mass or abnormalities in the pelvic region.  I think her discomfort is normal healing and she'll continue to monitor and follow up if it persists.

## 2012-01-13 NOTE — Patient Instructions (Signed)
Follow up for ultrasound as scheduled. Otherwise follow up in 6 months for check, sooner if any issues at all.

## 2012-05-08 ENCOUNTER — Encounter: Payer: Self-pay | Admitting: Gynecology

## 2012-05-08 ENCOUNTER — Ambulatory Visit (INDEPENDENT_AMBULATORY_CARE_PROVIDER_SITE_OTHER): Payer: No Typology Code available for payment source | Admitting: Gynecology

## 2012-05-08 DIAGNOSIS — N760 Acute vaginitis: Secondary | ICD-10-CM

## 2012-05-08 DIAGNOSIS — A499 Bacterial infection, unspecified: Secondary | ICD-10-CM

## 2012-05-08 DIAGNOSIS — B9689 Other specified bacterial agents as the cause of diseases classified elsewhere: Secondary | ICD-10-CM

## 2012-05-08 DIAGNOSIS — IMO0002 Reserved for concepts with insufficient information to code with codable children: Secondary | ICD-10-CM

## 2012-05-08 LAB — WET PREP FOR TRICH, YEAST, CLUE
Clue Cells Wet Prep HPF POC: NONE SEEN
Trich, Wet Prep: NONE SEEN
WBC, Wet Prep HPF POC: NONE SEEN
Yeast Wet Prep HPF POC: NONE SEEN

## 2012-05-08 MED ORDER — CLINDAMYCIN PHOSPHATE 2 % VA CREA: 1.0000 | TOPICAL_CREAM | Freq: Every day | VAGINAL | Status: DC

## 2012-05-08 NOTE — Patient Instructions (Signed)
Follow up for ultrasound as scheduled 

## 2012-05-08 NOTE — Progress Notes (Signed)
Patient presents with several issues. Notes having intercourse after 3 years of abstinence. Had a left-sided discomfort and dryness which ended the attempt. She is status post LAVH BSO 11/2011 where her area of endometriosis was also noted. Is on a bio equivalent hormone replacement to consist of estrogen and progesterone. Does not complaining of any vaginal dryness between. No discharge itching or odor.  Exam with Kim Asst. Abdomen soft nontender without masses guarding rebound organomegaly. Pelvic external BUS vagina grossly normal with slight white discharge. Normal depth and width and estrogenization. Bimanual without masses. Slight tenderness on the left although no palpable abnormalities. Rectovaginal exam is normal.  Assessment and plan: 1. Slight discharge. Wet prep suggestive of a low level bacterial vaginosis. We'll treat with Cleocin vaginal cream nightly x7 days. 2. Dyspareunia times one episode after 3 years of abstinence. Exam is normal. She is status post BSO. She did have endometriosis. Possible endometriosis or adhesive disease discussed. We'll start with ultrasound recognizing that she is status post BSO but looking for peritoneal cysts or small endometriomas.  Lubrication with intercourse discussed. Possible addition of vaginal estrogen also reviewed. Will followup for ultrasound and we'll go from there. 3. History of multiple breast surgeries. Patient asked me about treatment of chronic low grade staph. Apparently she had this issue previously in incision sites. She's had multiple breast enhancement and then removal of her implants by a physician in [place]. I recommended given her complex history that she followup with a general surgeon who deals with breast surgery to allow them to examine her and make recommendations about long-term treatment options. Patient agrees with this will help arrange this appointment.

## 2012-05-09 ENCOUNTER — Telehealth: Payer: Self-pay | Admitting: *Deleted

## 2012-05-09 DIAGNOSIS — N644 Mastodynia: Secondary | ICD-10-CM

## 2012-05-09 NOTE — Telephone Encounter (Signed)
appt with Dr. Jamey Ripa at 8:45 pm. 05/19/12. Pt informed.

## 2012-05-09 NOTE — Telephone Encounter (Signed)
Message copied by Aura Camps on Tue May 09, 2012  8:48 AM ------      Message from: Dara Lords      Created: Mon May 08, 2012  3:39 PM       Patient needs appointment with general surgeon who specializes in breast disease. She has a complex history of breast implants placed then removed with complaints of chronic staph infection in her breasts causing her pain. Had been taken care of by a physician in [place] but has no local followup and is complaining of pain now and asking for treatment for her "chronic staph ".   ------

## 2012-05-09 NOTE — Telephone Encounter (Signed)
Left message with referral coordinator to schedule and call back with time and date.

## 2012-05-19 ENCOUNTER — Ambulatory Visit (INDEPENDENT_AMBULATORY_CARE_PROVIDER_SITE_OTHER): Payer: No Typology Code available for payment source | Admitting: Surgery

## 2012-05-25 ENCOUNTER — Encounter: Payer: Self-pay | Admitting: Family Medicine

## 2012-05-25 ENCOUNTER — Ambulatory Visit (INDEPENDENT_AMBULATORY_CARE_PROVIDER_SITE_OTHER): Payer: No Typology Code available for payment source | Admitting: Family Medicine

## 2012-05-25 VITALS — BP 110/60 | Temp 98.3°F | Wt 150.0 lb

## 2012-05-25 DIAGNOSIS — M5416 Radiculopathy, lumbar region: Secondary | ICD-10-CM

## 2012-05-25 DIAGNOSIS — IMO0002 Reserved for concepts with insufficient information to code with codable children: Secondary | ICD-10-CM

## 2012-05-25 NOTE — Patient Instructions (Addendum)

## 2012-05-25 NOTE — Progress Notes (Signed)
Subjective:    Patient ID: Rose Robertson, female    DOB: August 19, 1960, 52 y.o.   MRN: [id number]  HPI  Chronic left lumbar back with radiation to buttock-since 1990s. Sharp.  Sitting worse.  Walking better Occasional numbness "whole LLE" No urine or stool incontinence. No claudication symptoms.  Recent disability evaluation and reportedly had x-rays ?hip or pelvis.  Pt not contacted. This pain off and on since 1990s.  Worse with weight loss. Severity of pain 10/10 at times. Ibuprofen no help.  She tried some PT but this was years ago and didn't help.  Complex past medical history as indicated below   Past Medical History  Diagnosis Date  . Depression   . GERD (gastroesophageal reflux disease)   . Urinary incontinence, stress   . Hypertension   . Anal fissure   . C. difficile diarrhea   . Hyperlipidemia   . Pancreatitis   . Pneumonia   . Asthma     no inhaler  . Dysrhythmia     tachycardia -controlled by med  . Fibromyalgia   . Spinal headache 1990    BP dropped after spinal anes.  . Diabetes mellitus without complication    Past Surgical History  Procedure Laterality Date  . Cholecystectomy  1991  . Tonsillectomy  1978  . Appendectomy  1987  . Breast enhancement surgery  1997  . Anal fissure repair  1985  . Tummy tuck  1997  . Breast lumps removal at time of implants removed  07/2010    2 each breast benign  . Breast enhancement surgery    . Breast biopsy  2011  . Breast surgery  6/12    implants removed  . Laparoscopic assisted vaginal hysterectomy  12/20/2011    Bilateral Salpingo-Oophorectomy  . Abdominal hysterectomy  10 21-2013    reports that she has been smoking Cigarettes.  She has a 3.4 pack-year smoking history. She has never used smokeless tobacco. She reports that  drinks alcohol. She reports that she does not use illicit drugs. family history includes Colon polyps in her sister; Coronary artery disease (age of onset: 100) in her mother;  Dementia in her father; Diabetes in her father, paternal grandmother, and paternal uncle; Hypertension in her father and mother; Ovarian cancer (age of onset: 71) in her maternal aunt; and Stroke in her father and mother. Allergies  Allergen Reactions  . Sulfa Antibiotics     Viviann Spare Johnson's syndrome  . Adhesive (Tape) Other (See Comments)    Blisters   . Latex     rash  . Pineapple Flavor       Review of Systems  Constitutional: Positive for fatigue. Negative for fever, chills, appetite change and unexpected weight change.  Respiratory: Negative for shortness of breath.   Cardiovascular: Negative for chest pain.  Genitourinary: Negative for dysuria.  Musculoskeletal: Positive for back pain.  Skin: Negative for rash.  Neurological: Positive for weakness and numbness. Negative for syncope.  Hematological: Negative for adenopathy.       Objective:   Physical Exam  Constitutional: She appears well-developed and well-nourished.  Cardiovascular: Normal rate and regular rhythm.   Pulmonary/Chest: Effort normal and breath sounds normal. No respiratory distress. She has no wheezes. She has no rales.  Musculoskeletal: She exhibits no edema.  Straight leg raise negative. 2+ dorsalis pedis pulses bilaterally. No lower extremity edema  Neurological:  2+ reflexes knee and ankle bilaterally. Full strength right lower extremity. Question of some mild weakness left dorsi flexion  and knee extension.  Normal sensory function to touch          Assessment & Plan:  Chronic left lumbar radiculopathy-type pains. Present for many years but worsening over the past couple months. Question of mild weakness above but symmetric reflexes. Given duration, lumbosacral spine films. Consider trial of physical therapy if no acute abnormalities. May eventually need MRI to further evaluate

## 2012-05-29 ENCOUNTER — Encounter: Payer: Self-pay | Admitting: Gynecology

## 2012-05-29 ENCOUNTER — Ambulatory Visit (INDEPENDENT_AMBULATORY_CARE_PROVIDER_SITE_OTHER): Payer: No Typology Code available for payment source

## 2012-05-29 ENCOUNTER — Ambulatory Visit (INDEPENDENT_AMBULATORY_CARE_PROVIDER_SITE_OTHER)
Admission: RE | Admit: 2012-05-29 | Discharge: 2012-05-29 | Disposition: A | Discharge status: Home / Self Care | Payer: No Typology Code available for payment source | Source: Ambulatory Visit | Attending: Family Medicine | Admitting: Family Medicine

## 2012-05-29 ENCOUNTER — Ambulatory Visit (INDEPENDENT_AMBULATORY_CARE_PROVIDER_SITE_OTHER): Payer: Self-pay | Admitting: Gynecology

## 2012-05-29 DIAGNOSIS — R102 Pelvic and perineal pain: Secondary | ICD-10-CM

## 2012-05-29 DIAGNOSIS — R1032 Left lower quadrant pain: Secondary | ICD-10-CM

## 2012-05-29 DIAGNOSIS — IMO0002 Reserved for concepts with insufficient information to code with codable children: Secondary | ICD-10-CM

## 2012-05-29 DIAGNOSIS — N949 Unspecified condition associated with female genital organs and menstrual cycle: Secondary | ICD-10-CM

## 2012-05-29 DIAGNOSIS — M5416 Radiculopathy, lumbar region: Secondary | ICD-10-CM

## 2012-05-29 DIAGNOSIS — N809 Endometriosis, unspecified: Secondary | ICD-10-CM

## 2012-05-29 NOTE — Patient Instructions (Signed)
If pain persists then we may need to consider laparoscopy. Monitor at present. Follow up with me as needed.

## 2012-05-29 NOTE — Progress Notes (Signed)
Patient presents for ultrasound. Has unusual history of deep dyspareunia times one episode of intercourse after years of abstinence. He is status post LAVH BSO 11/2011 with noted endometriosis fulgurated on bioequivalent HRT by another provider.  Exam was normal and she was asked to represent for ultrasound.  Ultrasound is negative for abnormalities. Both adnexa are clear without masses or other abnormalities. She does have loops of bowel in the left adnexa where her discomfort is.  Assessment and plan: Deep dyspareunia after one cortical episode: Years of abstinence. Unusual situation and she's not having pelvic pain otherwise. She is on bioequivalent HRT. The possibilities of endometriosis as the source of her pain discussed. Options to stop HRT to see if her pain does not regress weighed against the risks of symptoms from hypoestrogenism reviewed. She does not anticipate an active sexual life due to her husband's lack of interest at this point I recommend no intervention. If she does become routinely sexually active and pain persists and possibility for re\re laparoscopy rule out endometriosis discussed. Patient will followup with me as needed for this.

## 2012-06-05 ENCOUNTER — Ambulatory Visit (INDEPENDENT_AMBULATORY_CARE_PROVIDER_SITE_OTHER): Payer: No Typology Code available for payment source | Admitting: Surgery

## 2012-06-05 ENCOUNTER — Telehealth (INDEPENDENT_AMBULATORY_CARE_PROVIDER_SITE_OTHER): Payer: Self-pay | Admitting: General Surgery

## 2012-06-05 ENCOUNTER — Encounter (INDEPENDENT_AMBULATORY_CARE_PROVIDER_SITE_OTHER): Payer: Self-pay | Admitting: Surgery

## 2012-06-05 VITALS — BP 110/78 | HR 76 | Resp 16 | Ht 60.0 in | Wt 149.0 lb

## 2012-06-05 DIAGNOSIS — N6019 Diffuse cystic mastopathy of unspecified breast: Secondary | ICD-10-CM

## 2012-06-05 DIAGNOSIS — N6012 Diffuse cystic mastopathy of left breast: Secondary | ICD-10-CM

## 2012-06-05 NOTE — Patient Instructions (Signed)
We will arrange a mammogram since she hasn't had one for a while and we need to assess the left breast in particular since you're having symptoms there

## 2012-06-05 NOTE — Telephone Encounter (Signed)
Spoke with patient she is aware of ultra sound and Lt Mammogram 06/19/12 10:10

## 2012-06-05 NOTE — Progress Notes (Signed)
NAME: Rose Robertson DOB: Jun 10, 1960 MRN: [id number]                                                                                      DATE: 06/05/2012  PCP: Kristian Covey, MD Referring Provider: Elie Goody, NP  IMPRESSION:  Left breast pain, likely from some fibrocystic changes as well as postsurgical after multiple surgical procedures  PLAN:   Will go ahead and order a new mammogram since it has been a year. If there no abnormalities, there'll be no surgical indications present. I discussed at length with the patient. She is concerned she has some lingering infection in her breast I see no evidence on exam for that.                 CC:  Chief Complaint  Patient presents with  . Breast Pain    left    HPI:  Rose Robertson is a 52 y.o.  female who presents for evaluation of left breast pain was primarily in the 12:00 position going on for a month or 2. She has a complex breast history. Several years ago she had bilateral implants placed when she was in [place]. She was seen in our office in June of 2006 with some fibrocystic changes. She is seen again here in 2011 with some breast pain and a cyst of the left breast.since that visit she had surgery in [place] for removal of both implants. She notes that she has a staph infection as well as some "moles" infection. At the time of surgery it took 2 "masses "from each breast but all were benign. She had some resultant deformity of the nipple areas bilaterally. She notes that she is sorry that she had implants done in the first place. She is not really having a pain in the right breast. On the left side of his family and a 12:00 position. Family history is negative for breast cancer but does have ovarian cancer. She's not had a mammogram for about a year. She says that after her implants removed she had an MRI at [facility] it was negative. Hearing is concerned she has some low-grade infection in her breast as the  cause of her discomfort and that she has slightly elevated white count of 12,000.  PMH:  has a past medical history of Depression; GERD (gastroesophageal reflux disease); Urinary incontinence, stress; Hypertension; Anal fissure; C. difficile diarrhea; Hyperlipidemia; Pancreatitis; Pneumonia; Asthma; Dysrhythmia; Fibromyalgia; Spinal headache (1990); Diabetes mellitus without complication; Tailbone injury (1990); and Stevens-Johnson syndrome.  PSH:   has past surgical history that includes Cholecystectomy (1991); Tonsillectomy (1978); Appendectomy (1987); Breast enhancement surgery (1997); Anal fissure repair (1985); tummy tuck (1997); breast lumps removal at time of implants removed (07/2010); Breast enhancement surgery; Breast biopsy (2011); Breast surgery (6/12); Laparoscopic assisted vaginal hysterectomy (12/20/2011); and Abdominal hysterectomy (10 21-2013).  ALLERGIES:   Allergies  Allergen Reactions  . Sulfa Antibiotics     Viviann Spare Johnson's syndrome  . Adhesive (Tape) Other (See Comments)    Blisters   . Latex     rash  . Pineapple Flavor     MEDICATIONS: Current outpatient prescriptions:cholecalciferol (VITAMIN  D) 1000 UNITS tablet, Take 5,000 Units by mouth daily. , Disp: , Rfl: ;  cholestyramine (QUESTRAN) 4 G packet, Take 1 packet by mouth 3 (three) times daily with meals., Disp: , Rfl: ;  fish oil-omega-3 fatty acids 1000 MG capsule, Take 1,200 mg by mouth daily., Disp: , Rfl:  ibuprofen (ADVIL,MOTRIN) 800 MG tablet, Take 1 tablet (800 mg total) by mouth every 8 (eight) hours as needed for pain., Disp: 30 tablet, Rfl: 1;  metFORMIN (GLUCOPHAGE) 500 MG tablet, Take 500 mg by mouth 2 (two) times daily with a meal. Per Dr brown, Disp: , Rfl: ;  milk thistle 175 MG tablet, Take 175 mg by mouth daily., Disp: , Rfl: ;  NALTREXONE HCL PO, Take 4.5 mg by mouth at bedtime. Uses Naltrexone 4.5mg  #3, Disp: , Rfl:  nebivolol (BYSTOLIC) 5 MG tablet, Take 5 mg by mouth daily. , Disp: , Rfl: ;  NON  FORMULARY, Apply topically daily. Bi-EST 60-40mg   HRT cream, Disp: , Rfl: ;  NONFORMULARY OR COMPOUNDED ITEM, Compounded T3, Disp: , Rfl: ;  NONFORMULARY OR COMPOUNDED ITEM, 1 Troche by Submucosal route daily. MMB #6 5-40mg , Disp: , Rfl: ;  NONFORMULARY OR COMPOUNDED ITEM, Take 580 mg by mouth daily. Cell Gevity, Disp: , Rfl:  OREGANO PO, Take 460 mg by mouth 2 (two) times daily., Disp: , Rfl: ;  pantoprazole (PROTONIX) 40 MG tablet, Take 40 mg by mouth daily.  , Disp: , Rfl:   ROS: She has filled out our 12 point review of systems and it is negative except as noted in the history of present illness and past medical history. EXAM:   Vital signs:BP 110/78  Pulse 76  Resp 16  Ht 5' (1.524 m)  Wt 149 lb (67.586 kg)  BMI 29.1 kg/m2  LMP 12/05/2011 General: This alert oriented generally healthy-appearing Breasts: The breasts are essentially symmetric as is she. There is scarring of the skin bilaterally from her surgeries. She has some mild fibrocystic type changes to palpation bilaterally. There is no dominant mass on either side. She is tender however in the left breast in the upper half primarily the lateral upper half.  Lymphatics: She has no axillary or supraclavicular adenopathy on either side  DATA REVIEWED:  I have reviewed numerous notes in the electronic medical record as well as the old paper chart and she has from prior visits saying other surgeons in this practice.    Candon Caras J 06/05/2012  CC: Elie Goody, NP, Kristian Covey, MD

## 2012-06-15 ENCOUNTER — Telehealth: Payer: Self-pay | Admitting: Family Medicine

## 2012-06-15 NOTE — Telephone Encounter (Signed)
Patient Information:  Caller Name: Leonda  Phone: [phone]  Patient: Rose Robertson, Rose Robertson  Gender: Female  DOB: 06-26-1960  Age: 52 Years  PCP: Evelena Peat [facility])  Pregnant: No  Office Follow Up:  Does the office need to follow up with this patient?: No  Instructions For The Office: N/A  RN Note:  Gets short of breath with any activity.  Feels short of breath and "exhaused" while talking on phone. No auddible wheezing.  Respiratory rate 12 rpm. Coughing causes muscle soreness.  Able to take a deep breath; at times deep breath triggers cough.  Does not test blood sugar; MD never ordered her to do so.  History of asthma; has not used inhaler "in a long time."  Agreed to be seen either at [facility] ED or Urgent Care.   Symptoms  Reason For Call & Symptoms: Cough and nasal congestion with burning sensation of left notstril and left sinus pain.  When takes a deep breath, feels "bubbles in it". Denies wheezing.  Reviewed Health History In EMR: Yes  Reviewed Medications In EMR: Yes  Reviewed Allergies In EMR: Yes  Reviewed Surgeries / Procedures: Yes  Date of Onset of Symptoms: 06/11/2012  Treatments Tried: natural immune system booster, Nyquil, Halls cough drops  Treatments Tried Worked: No OB / GYN:  LMP: Unknown  Guideline(s) Used:  Sinus Pain and Congestion  Disposition Per Guideline:   Go to ED Now  Reason For Disposition Reached:   Difficulty breathing, and not from stuffy nose (e.g., not relieved by cleaning out the nose)  Advice Given:  N/A  Patient Will Follow Care Advice:  YES

## 2012-06-19 ENCOUNTER — Ambulatory Visit
Admission: RE | Admit: 2012-06-19 | Discharge: 2012-06-19 | Disposition: A | Discharge status: Home / Self Care | Payer: No Typology Code available for payment source | Source: Ambulatory Visit | Attending: Surgery | Admitting: Surgery

## 2012-06-19 DIAGNOSIS — N6012 Diffuse cystic mastopathy of left breast: Secondary | ICD-10-CM

## 2013-12-31 ENCOUNTER — Encounter (INDEPENDENT_AMBULATORY_CARE_PROVIDER_SITE_OTHER): Payer: Self-pay | Admitting: Surgery

## 2014-05-13 ENCOUNTER — Ambulatory Visit: Payer: No Typology Code available for payment source | Attending: Internal Medicine

## 2014-05-21 ENCOUNTER — Encounter: Payer: Self-pay | Admitting: Family Medicine

## 2014-05-21 ENCOUNTER — Ambulatory Visit: Payer: Self-pay | Attending: Family Medicine | Admitting: Family Medicine

## 2014-05-21 VITALS — BP 132/83 | HR 66 | Temp 98.2°F | Resp 16 | Ht 60.0 in | Wt 161.0 lb

## 2014-05-21 DIAGNOSIS — M797 Fibromyalgia: Secondary | ICD-10-CM | POA: Insufficient documentation

## 2014-05-21 DIAGNOSIS — R10A2 Flank pain, left side: Secondary | ICD-10-CM | POA: Insufficient documentation

## 2014-05-21 DIAGNOSIS — J320 Chronic maxillary sinusitis: Secondary | ICD-10-CM | POA: Insufficient documentation

## 2014-05-21 DIAGNOSIS — N644 Mastodynia: Secondary | ICD-10-CM | POA: Insufficient documentation

## 2014-05-21 DIAGNOSIS — G8929 Other chronic pain: Secondary | ICD-10-CM | POA: Insufficient documentation

## 2014-05-21 DIAGNOSIS — M25552 Pain in left hip: Secondary | ICD-10-CM | POA: Insufficient documentation

## 2014-05-21 DIAGNOSIS — R109 Unspecified abdominal pain: Secondary | ICD-10-CM

## 2014-05-21 DIAGNOSIS — R739 Hyperglycemia, unspecified: Secondary | ICD-10-CM

## 2014-05-21 DIAGNOSIS — Z9886 Personal history of breast implant removal: Secondary | ICD-10-CM | POA: Insufficient documentation

## 2014-05-21 DIAGNOSIS — R1012 Left upper quadrant pain: Secondary | ICD-10-CM

## 2014-05-21 DIAGNOSIS — E669 Obesity, unspecified: Secondary | ICD-10-CM

## 2014-05-21 DIAGNOSIS — B948 Sequelae of other specified infectious and parasitic diseases: Secondary | ICD-10-CM | POA: Insufficient documentation

## 2014-05-21 HISTORY — DX: Unspecified abdominal pain: R10.9

## 2014-05-21 LAB — GLUCOSE, POCT (MANUAL RESULT ENTRY): POC Glucose: 93 mg/dl (ref 70–99)

## 2014-05-21 LAB — POCT GLYCOSYLATED HEMOGLOBIN (HGB A1C): Hemoglobin A1C: 5.8

## 2014-05-21 NOTE — Assessment & Plan Note (Addendum)
L flank pain- muscle spasm suspected Treatment options: heat, injection of steroid (depo medrol plus lidocaine) or just lidocaine.  Evaluate with chest x-ray ordered- [facility] for imaging

## 2014-05-21 NOTE — Patient Instructions (Signed)
Rose Robertson,  Thank you for coming in today. It was a pleasure meeting you. I look forward to being your primary doctor.  1. L flank pain- muscle spasm suspected Treatment options: heat, injection of steroid (depo medrol plus lidocaine) or just lidocaine.  Evaluate with chest x-ray ordered- go [facility] for imaging   2.  R maxillary sinus- cyst vs sinusitis. Recent MRI in 2011 suggested sinusitis. This could be fungal given your history. ENT referral   3. Breast fungus- Exam and screening mammogram planned  F/u in 3-4 weeks for physical with breast and pap  Dr. Adrian Blackwater

## 2014-05-21 NOTE — Progress Notes (Signed)
Establish Care Lt chest area feeling burning sensation x 6 month no history Injury  Hip pain for about two year no Hx injury

## 2014-05-21 NOTE — Progress Notes (Signed)
   Subjective:    Patient ID: Rose Robertson, female    DOB: 05-07-1960, 54 y.o.   MRN: [id number] CC: establish care, L flank pain, L shoulder pain,  L hip pain, R maxillary sinus lesion  HPI  1. L flank pain: x 3 months. Worse with movement. Tender. Pain wraps around anteriorly to posteriorly. Also radiates to L shoulder.  No fever, chills cough. Massage with essential oils helps some.   2. L hip pain: x 3 years. Has fibromyalgia. Pain is daily. Worsening. No trauma.  3. R maxillary sinus lesion: first noted on MRI done in 2008 for numbness. F/u MRI in 2011 revealed evidence of persistent sinusitis. Patient with headaches and worsening vision. Has hx of fungemia and staph bacteremia associated with infected breast implants that were removed in 2012.   Soc Hx: non smoker Med Hx: b/l breast fungal infection and fungemia and staph infection in 2008/2009 related to implants s/p removal of implants Surg Hx: breast implant removal Fam Hx: father with DM2 gj  Review of Systems  Constitutional: Negative for fever, chills and unexpected weight change.  HENT: Positive for congestion. Negative for facial swelling.   Eyes: Positive for visual disturbance.  Respiratory: Negative for cough, chest tightness, shortness of breath and wheezing.   Cardiovascular: Positive for leg swelling. Negative for chest pain and palpitations.  Musculoskeletal: Positive for myalgias and arthralgias.  Skin: Positive for color change.       L breast redness during showers   Allergic/Immunologic: Negative for immunocompromised state.  Neurological: Positive for headaches. Negative for tremors, seizures, syncope, weakness and numbness.  Psychiatric/Behavioral: Positive for confusion and decreased concentration.       Objective:   Physical Exam BP 132/83 mmHg  Pulse 66  Temp(Src) 98.2 F (36.8 C) (Oral)  Resp 16  Ht 5' (1.524 m)  Wt 161 lb (73.029 kg)  BMI 31.44 kg/m2  SpO2 99%  LMP  12/05/2011 General appearance: alert, cooperative and no distress Lungs: clear to auscultation bilaterally Heart: regular rate and rhythm, S1, S2 normal, no murmur, click, rub or gallop Abdomen: soft, non-tender; bowel sounds normal; no masses,  no organomegaly Extremities: extremities normal, atraumatic, no cyanosis or edema  MSK: point tenderness, L upper abdomen and flank just below last rib Psych: normal speech, slightly agitated when discussing history regarding breast implants and fungal infection Neuro: normal CNs, normal gait, normal tone and sensation      Assessment & Plan:

## 2014-05-22 ENCOUNTER — Encounter: Payer: Self-pay | Admitting: Family Medicine

## 2014-05-22 ENCOUNTER — Ambulatory Visit: Payer: No Typology Code available for payment source | Admitting: Cardiology

## 2014-05-22 DIAGNOSIS — G8929 Other chronic pain: Secondary | ICD-10-CM | POA: Insufficient documentation

## 2014-05-22 DIAGNOSIS — E66811 Obesity, class 1: Secondary | ICD-10-CM | POA: Insufficient documentation

## 2014-05-22 DIAGNOSIS — E669 Obesity, unspecified: Secondary | ICD-10-CM | POA: Insufficient documentation

## 2014-05-22 DIAGNOSIS — M25552 Pain in left hip: Secondary | ICD-10-CM

## 2014-05-22 HISTORY — DX: Obesity, class 1: E66.811

## 2014-05-22 HISTORY — DX: Obesity, unspecified: E66.9

## 2014-05-22 HISTORY — DX: Other chronic pain: G89.29

## 2014-05-22 NOTE — Assessment & Plan Note (Signed)
R maxillary sinus- cyst vs sinusitis. Recent MRI in 2011 suggested sinusitis. This could be fungal given your history. ENT referral

## 2014-05-22 NOTE — Assessment & Plan Note (Signed)
A: 2 yrs of L hip pain P:  DG L hip

## 2014-05-22 NOTE — Assessment & Plan Note (Signed)
A: ongoing breast pain with hx significant for fungemia and bacteremia P: breast exam, screening mammogram

## 2014-05-25 ENCOUNTER — Ambulatory Visit (HOSPITAL_COMMUNITY)
Admission: AD | Admit: 2014-05-25 | Discharge: 2014-05-25 | Disposition: A | Discharge status: Home / Self Care | Payer: Self-pay | Source: Ambulatory Visit | Attending: Family Medicine | Admitting: Family Medicine

## 2014-05-25 ENCOUNTER — Ambulatory Visit (HOSPITAL_COMMUNITY)
Admission: RE | Admit: 2014-05-25 | Discharge: 2014-05-25 | Disposition: A | Discharge status: Home / Self Care | Payer: Self-pay | Source: Ambulatory Visit | Attending: Family Medicine | Admitting: Family Medicine

## 2014-05-25 DIAGNOSIS — M25552 Pain in left hip: Secondary | ICD-10-CM | POA: Insufficient documentation

## 2014-05-25 DIAGNOSIS — R109 Unspecified abdominal pain: Secondary | ICD-10-CM

## 2014-05-25 DIAGNOSIS — G8929 Other chronic pain: Secondary | ICD-10-CM

## 2014-05-28 ENCOUNTER — Telehealth: Payer: Self-pay | Admitting: Family Medicine

## 2014-05-28 ENCOUNTER — Other Ambulatory Visit: Payer: Self-pay | Admitting: Family Medicine

## 2014-05-28 NOTE — Telephone Encounter (Signed)
Patient called to request her x-ray results, please f/u with pt.

## 2014-05-29 ENCOUNTER — Encounter: Payer: Self-pay | Admitting: Cardiology

## 2014-05-29 ENCOUNTER — Ambulatory Visit: Payer: Self-pay | Attending: Cardiology | Admitting: Cardiology

## 2014-05-29 VITALS — BP 130/78 | HR 94 | Temp 98.3°F | Resp 18 | Ht 60.0 in | Wt 158.0 lb

## 2014-05-29 DIAGNOSIS — R0789 Other chest pain: Secondary | ICD-10-CM | POA: Insufficient documentation

## 2014-05-29 DIAGNOSIS — I2510 Atherosclerotic heart disease of native coronary artery without angina pectoris: Secondary | ICD-10-CM | POA: Insufficient documentation

## 2014-05-29 DIAGNOSIS — I10 Essential (primary) hypertension: Secondary | ICD-10-CM | POA: Insufficient documentation

## 2014-05-29 DIAGNOSIS — F17210 Nicotine dependence, cigarettes, uncomplicated: Secondary | ICD-10-CM | POA: Insufficient documentation

## 2014-05-29 DIAGNOSIS — E119 Type 2 diabetes mellitus without complications: Secondary | ICD-10-CM | POA: Insufficient documentation

## 2014-05-29 DIAGNOSIS — M797 Fibromyalgia: Secondary | ICD-10-CM | POA: Insufficient documentation

## 2014-05-29 HISTORY — DX: Other chest pain: R07.89

## 2014-05-29 MED ORDER — NEBIVOLOL HCL 5 MG PO TABS: 5.0000 mg | ORAL_TABLET | Freq: Every day | ORAL | Status: DC

## 2014-05-29 NOTE — Patient Instructions (Signed)
Continue taking Bystolic 5 mg daily. Refill has been sent to [facility] [place]. Follow-up as soon as possible with Dr. Adrian Blackwater to address depression and anxiety.

## 2014-05-29 NOTE — Progress Notes (Signed)
HPI [name] comes in today referred by Dr.Fuenches for the evaluation of chest Lular Letson pain.  She complains of her left side of her chest around to her back being on fire. Chest x-ray was negative. She has a history of fibromyalgia. She also had a history of failed breast implants and had to have these removed. She said the outside the implants were full of mold. She was left with some scar tissue.   Her discomfort is not exertionally related. She has no history of coronary disease. Her risk factors do include hypertension and smoking. She denies any cough or hemoptysis. There is no orthopnea PND or edema.    Past Medical History  Diagnosis Date  . Urinary incontinence, stress   . Anal fissure   . C. difficile diarrhea   . Pancreatitis   . Pneumonia   . Dysrhythmia     tachycardia -controlled by med  . Spinal headache 1990    BP dropped after spinal anes.  . Tailbone injury 1990    broken   . Stevens-Johnson syndrome 1982    related to allergic rxn to sulfa drugs   . Asthma Dx 1991    no inhaler  . Depression     At age of 73  . Diabetes mellitus without complication Dx 0175  . GERD (gastroesophageal reflux disease) Dx 2008  . Hyperlipidemia Dx 2010  . Hypertension Dx 2010  . Fibromyalgia Dx 2010  . Thyroid disease Dx 2010  . Infection of breast implant 1998-2012    staph and fungemia. breat removed in 2012 in [place]     Current Outpatient Prescriptions  Medication Sig Dispense Refill  . cholecalciferol (VITAMIN D) 1000 UNITS tablet Take 5,000 Units by mouth daily.     . fish oil-omega-3 fatty acids 1000 MG capsule Take 1,200 mg by mouth daily.    . nebivolol (BYSTOLIC) 5 MG tablet Take 1 tablet (5 mg total) by mouth daily. 30 tablet 11  . OREGANO PO Take 460 mg by mouth 2 (two) times daily.    . cholestyramine (QUESTRAN) 4 G packet Take 1 packet by mouth 3 (three) times daily with meals.    . metFORMIN (GLUCOPHAGE) 500 MG tablet Take 500 mg by mouth 2 (two) times  daily with a meal. Per Dr brown    . milk thistle 175 MG tablet Take 175 mg by mouth daily.    Marland Kitchen NALTREXONE HCL PO Take 4.5 mg by mouth at bedtime. Uses Naltrexone 4.5mg  #3    . NON FORMULARY Apply topically daily. Bi-EST 60-40mg   HRT cream    . NONFORMULARY OR COMPOUNDED ITEM Compounded T3    . NONFORMULARY OR COMPOUNDED ITEM 1 Troche by Submucosal route daily. MMB #6 5-40mg     . NONFORMULARY OR COMPOUNDED ITEM Take 580 mg by mouth daily. Cell Gevity    . pantoprazole (PROTONIX) 40 MG tablet Take 40 mg by mouth daily.       No current facility-administered medications for this visit.    Allergies  Allergen Reactions  . Sulfa Antibiotics     Remo Lipps Johnson's syndrome  . Adhesive [Tape] Other (See Comments)    Blisters   . Latex     rash  . Pineapple Flavor     Family History  Problem Relation Age of Onset  . Coronary artery disease Mother 31  . Hypertension Mother   . Stroke Mother   . Hypertension Father   . Diabetes Father     type 2  .  Dementia Father   . Stroke Father     deceased, passed away had over 100 mini strokes  . Colon polyps Sister   . Diabetes Paternal Uncle   . Diabetes Paternal Grandmother   . Ovarian cancer Maternal Aunt 80    History   Social History  . Marital Status: Married    Spouse Name: N/A  . Number of Children: 2  . Years of Education: N/A   Occupational History  . unemployed    Social History Main Topics  . Smoking status: Current Every Day Smoker -- 0.10 packs/day for 34 years    Types: Cigarettes  . Smokeless tobacco: Never Used  . Alcohol Use: Yes     Comment: occasionally  . Drug Use: No  . Sexual Activity: Yes    Birth Control/ Protection: None     Comment: ? post menopausal   Other Topics Concern  . Not on file   Social History Narrative    ROS ALL NEGATIVE EXCEPT THOSE NOTED IN HPI  PE  General Appearance: well developed, well nourished in no acute distress, obese HEENT: symmetrical face, PERRLA, good  dentition  Neck: no JVD, thyromegaly, or adenopathy, trachea midline Chest: symmetric without deformity Cardiac: PMI non-displaced, RRR, normal S1, S2, no gallop or murmur Lung: clear to ausculation  Vascular: all pulses full without bruits  Extremities: no cyanosis, clubbing or edema, no sign of DVT, no varicosities  Skin: normal color, no rashes Neuro: alert and oriented x 3, non-focal Pysch: normal affect  EKG Not done BMET    Component Value Date/Time   NA 138 12/13/2011 1215   K 3.8 12/13/2011 1215   CL 103 12/13/2011 1215   CO2 21 12/13/2011 1215   GLUCOSE 90 12/13/2011 1215   BUN 12 12/13/2011 1215   CREATININE 0.65 12/13/2011 1215   CALCIUM 9.4 12/13/2011 1215   GFRNONAA >90 12/13/2011 1215   GFRAA >90 12/13/2011 1215    Lipid Panel  No results found for: CHOL, TRIG, HDL, CHOLHDL, VLDL, LDLCALC  CBC    Component Value Date/Time   WBC 10.7* 12/13/2011 1215   RBC 4.31 12/13/2011 1215   HGB 12.8 12/13/2011 1215   HCT 37.7 12/13/2011 1215   PLT 349 12/13/2011 1215   MCV 87.5 12/13/2011 1215   MCH 29.7 12/13/2011 1215   MCHC 34.0 12/13/2011 1215   RDW 13.2 12/13/2011 1215   LYMPHSABS 3.3 12/07/2011 1202   MONOABS 0.8 12/07/2011 1202   EOSABS 0.1 12/07/2011 1202   BASOSABS 0.0 12/07/2011 1202

## 2014-05-29 NOTE — Progress Notes (Addendum)
Patient here to establish care with Cardiologist. Patient indicates she has a history of hypertension, irregular heart rate. Dx 8657, on Bystolic 5 mg daily. Patient also indicates she has a constant "discomfort" in chest that she describes as "tightness" and indicates it is worse when lying down. Patient denies palpitations at this time. Complaining of pain-6/10 that is left-sided-in hip and lung. Also complaining of dry mouth and eyes. Patient complaining of stress. Depression screening (PHQ-9 Score 21) and anxiety screening (GAD-7 Score 17) completed. Patient does not have a plan to commit suicide; she indicates "I would never commit suicide due to religious convictions, and I want to go to heaven."  Patient was discussed with Education officer, museum. Patient given information about [facility] and informed to walk-in today to discuss depression and suicide with Psychiatry. Patient also instructed to follow-up with Dr. Adrian Blackwater about depression/suicide.

## 2014-05-29 NOTE — Assessment & Plan Note (Signed)
This is most likely from previous surgery and scar tissue from her breast implant removal. This also could be related to her fibromyalgia. This is clearly not cardiac or pulmonary. Reassurance given. No follow-up with me necessary.

## 2014-05-30 ENCOUNTER — Ambulatory Visit: Payer: Self-pay | Attending: Family Medicine | Admitting: Family Medicine

## 2014-05-30 ENCOUNTER — Ambulatory Visit: Payer: Self-pay

## 2014-05-30 ENCOUNTER — Encounter: Payer: Self-pay | Admitting: Family Medicine

## 2014-05-30 VITALS — BP 125/80 | HR 78 | Temp 98.6°F | Resp 16 | Ht 60.0 in | Wt 158.0 lb

## 2014-05-30 DIAGNOSIS — M19041 Primary osteoarthritis, right hand: Secondary | ICD-10-CM | POA: Insufficient documentation

## 2014-05-30 DIAGNOSIS — Z90710 Acquired absence of both cervix and uterus: Secondary | ICD-10-CM

## 2014-05-30 DIAGNOSIS — M79643 Pain in unspecified hand: Secondary | ICD-10-CM | POA: Insufficient documentation

## 2014-05-30 DIAGNOSIS — Z0000 Encounter for general adult medical examination without abnormal findings: Secondary | ICD-10-CM

## 2014-05-30 DIAGNOSIS — M19042 Primary osteoarthritis, left hand: Secondary | ICD-10-CM

## 2014-05-30 DIAGNOSIS — N898 Other specified noninflammatory disorders of vagina: Secondary | ICD-10-CM | POA: Insufficient documentation

## 2014-05-30 DIAGNOSIS — M797 Fibromyalgia: Secondary | ICD-10-CM

## 2014-05-30 DIAGNOSIS — N644 Mastodynia: Secondary | ICD-10-CM

## 2014-05-30 DIAGNOSIS — Z113 Encounter for screening for infections with a predominantly sexual mode of transmission: Secondary | ICD-10-CM

## 2014-05-30 HISTORY — DX: Encounter for general adult medical examination without abnormal findings: Z00.00

## 2014-05-30 HISTORY — DX: Acquired absence of both cervix and uterus: Z90.710

## 2014-05-30 HISTORY — DX: Primary osteoarthritis, right hand: M19.041

## 2014-05-30 LAB — POCT URINALYSIS DIPSTICK
Bilirubin, UA: NEGATIVE
Glucose, UA: NEGATIVE
Ketones, UA: NEGATIVE
Leukocytes, UA: NEGATIVE
Nitrite, UA: NEGATIVE
Protein, UA: NEGATIVE
Spec Grav, UA: >=1.03
Urobilinogen, UA: 0.2
pH, UA: 5.5

## 2014-05-30 LAB — CBC
HCT: 43.5 % (ref 36.0–46.0)
Hemoglobin: 15.3 g/dL — ABNORMAL HIGH (ref 12.0–15.0)
MCH: 31.2 pg (ref 26.0–34.0)
MCHC: 35.2 g/dL (ref 30.0–36.0)
MCV: 88.6 fL (ref 78.0–100.0)
MPV: 9.9 fL (ref 8.6–12.4)
Platelets: 374 10*3/uL (ref 150–400)
RBC: 4.91 MIL/uL (ref 3.87–5.11)
RDW: 13.1 % (ref 11.5–15.5)
WBC: 11.1 10*3/uL — ABNORMAL HIGH (ref 4.0–10.5)

## 2014-05-30 LAB — HIV ANTIBODY (ROUTINE TESTING W REFLEX): HIV 1&2 Ab, 4th Generation: NONREACTIVE

## 2014-05-30 LAB — COMPLETE METABOLIC PANEL WITH GFR
ALT: 16 U/L (ref 0–35)
AST: 16 U/L (ref 0–37)
Albumin: 4.3 g/dL (ref 3.5–5.2)
Alkaline Phosphatase: 52 U/L (ref 39–117)
BUN: 12 mg/dL (ref 6–23)
CO2: 25 mEq/L (ref 19–32)
Calcium: 9.1 mg/dL (ref 8.4–10.5)
Chloride: 102 mEq/L (ref 96–112)
Creat: 0.77 mg/dL (ref 0.50–1.10)
GFR, Est African American: >89 mL/min
GFR, Est Non African American: 88 mL/min
Glucose, Bld: 77 mg/dL (ref 70–99)
Potassium: 4.7 mEq/L (ref 3.5–5.3)
Sodium: 135 mEq/L (ref 135–145)
Total Bilirubin: 0.5 mg/dL (ref 0.2–1.2)
Total Protein: 6.5 g/dL (ref 6.0–8.3)

## 2014-05-30 MED ORDER — NAPROXEN 500 MG PO TABS: 500.0000 mg | ORAL_TABLET | Freq: Two times a day (BID) | ORAL | Status: DC

## 2014-05-30 NOTE — Patient Instructions (Addendum)
Mrs. Humphrey Rolls,  Thank you for coming in today.   1. Vaginal pap done today: given you had a hysterectomy for a non-cancer reason. You no longer need pap smears.   2. Mammogram ordered today, you will be called to schedule or you may call the breast imaging center.   3. Normal UA except for small blood and elevated specific gravity, please drink more water.  4. Hand pain and joint swelling: take naproxen 500 mg twice daily with food for 5 days, then start ice therapy 20 minutes twice daily and continue stretching.   You will be called with lab results.   F/u in 3 months  Dr. Adrian Blackwater

## 2014-05-30 NOTE — Progress Notes (Signed)
Annual Physicanal and pap Last pap 2012 HYST due to endometriosis and tumor

## 2014-05-30 NOTE — Progress Notes (Signed)
   Subjective:    Patient ID: Rose Robertson, female    DOB: 1960/09/22, 54 y.o.   MRN: [id number] CC: pap smear and breast exam   HPI  1. Breast pain: L>R, no niople discharge. No breast mass. Redness in breast after showers only.  2. ? Due for pap: s/p hysterectomy for endometriosis and fibroid 12/20/2011.  Intermittent vaginal discharge. No odor or itching. Not sexually active with husband.   3. Hand pain: with swelling in finger. Redness in palms, x 6 months. Ice is not  Helping. No trauma.   Soc Hx: non smoker  Review of Systems  Constitutional: Negative for fever.  Genitourinary: Negative for vaginal bleeding, genital sores and vaginal pain.  urine is dark Negative dysuria      Objective:   Physical Exam BP 125/80 mmHg  Pulse 78  Temp(Src) 98.6 F (37 C) (Oral)  Resp 16  Ht 5' (1.524 m)  Wt 158 lb (71.668 kg)  BMI 30.86 kg/m2  SpO2 97%  LMP 12/05/2011 General appearance: alert, cooperative and no distress Lungs: clear to auscultation bilaterally Breasts: Inspection negative, No nipple retraction or dimpling, No nipple discharge or bleeding, No axillary or supraclavicular adenopathy, tender to palpation both breast inferiorly. no masses. there is a concavity to L lateral breast consistent with prior surgery Heart: regular rate and rhythm, S1, S2 normal, no murmur, click, rub or gallop Abdomen: soft, non-tender; bowel sounds normal; no masses,  no organomegaly Pelvic: normal external genitalia, uterus and cervix surgically absent  Ext: mild erythema in palms. TTP PIP joints on both hands R>L    Assessment & Plan:

## 2014-05-31 LAB — GC/CHLAMYDIA PROBE AMP, URINE
Chlamydia, Swab/Urine, PCR: NEGATIVE
GC Probe Amp, Urine: NEGATIVE

## 2014-05-31 LAB — CERVICOVAGINAL ANCILLARY ONLY: Wet Prep (BD Affirm): NEGATIVE

## 2014-05-31 LAB — CYTOLOGY - PAP

## 2014-05-31 LAB — VITAMIN D 25 HYDROXY (VIT D DEFICIENCY, FRACTURES): Vit D, 25-Hydroxy: 63 ng/mL (ref 30–100)

## 2014-06-02 NOTE — Assessment & Plan Note (Signed)
Negative GC/Chlam, wet prep, screening HIV

## 2014-06-02 NOTE — Assessment & Plan Note (Signed)
1. Vaginal pap done today: given you had a hysterectomy for a non-cancer reason. You no longer need pap smears.

## 2014-06-02 NOTE — Assessment & Plan Note (Signed)
2. Mammogram ordered today, you will be called to schedule or you may call the breast imaging center.

## 2014-06-02 NOTE — Assessment & Plan Note (Signed)
Hand pain and joint swelling: take naproxen 500 mg twice daily with food for 5 days, then start ice therapy 20 minutes twice daily and continue stretching.

## 2014-06-03 ENCOUNTER — Telehealth: Payer: Self-pay

## 2014-06-04 NOTE — Telephone Encounter (Signed)
-----   Message from Boykin Nearing, MD sent at 05/27/2014 12:18 PM EDT ----- Normal hip x-ray

## 2014-06-04 NOTE — Telephone Encounter (Signed)
Completed call.  

## 2014-06-04 NOTE — Telephone Encounter (Signed)
-----   Message from Boykin Nearing, MD sent at 06/03/2014  3:55 PM EDT ----- Neg pap, repeat in 5 years

## 2014-06-05 ENCOUNTER — Telehealth: Payer: Self-pay | Admitting: *Deleted

## 2014-06-05 NOTE — Telephone Encounter (Signed)
Patient called in the am asking for her blood work results.  Please follow up

## 2014-06-05 NOTE — Telephone Encounter (Signed)
-----   Message from Boykin Nearing, MD sent at 05/31/2014  8:42 AM EDT ----- Screening HIV negative, normal CMP, normal vit D, negative Gc/chlam slightly elevated WBC and Hgb otherwise normal CBC

## 2014-06-05 NOTE — Telephone Encounter (Signed)
Results are in

## 2014-06-05 NOTE — Telephone Encounter (Signed)
Pt aware of results 

## 2014-06-21 ENCOUNTER — Other Ambulatory Visit: Payer: Self-pay | Admitting: Internal Medicine

## 2014-06-21 MED ORDER — NEBIVOLOL HCL 5 MG PO TABS: 5.0000 mg | ORAL_TABLET | Freq: Every day | ORAL | Status: DC

## 2014-07-03 ENCOUNTER — Telehealth: Payer: Self-pay | Admitting: General Practice

## 2014-07-03 NOTE — Telephone Encounter (Signed)
Patient calling to request medication refill for:   pantoprazole (PROTONIX) 40 MG tablet     This medication was not prescribed by provider here. However patient has since established care here and was told that her PCP should refill. Patient uses [facility]. Please assist.

## 2014-07-08 ENCOUNTER — Other Ambulatory Visit: Payer: Self-pay | Admitting: *Deleted

## 2014-07-08 MED ORDER — PANTOPRAZOLE SODIUM 40 MG PO TBEC: 40.0000 mg | DELAYED_RELEASE_TABLET | Freq: Every day | ORAL | Status: DC

## 2014-07-08 NOTE — Telephone Encounter (Signed)
Rx refill, send to [facility] pharmacy  Pt aware

## 2014-08-28 ENCOUNTER — Other Ambulatory Visit: Payer: Self-pay | Admitting: Family Medicine

## 2014-08-28 ENCOUNTER — Telehealth: Payer: Self-pay | Admitting: Family Medicine

## 2014-08-28 DIAGNOSIS — K219 Gastro-esophageal reflux disease without esophagitis: Secondary | ICD-10-CM

## 2014-08-28 NOTE — Telephone Encounter (Signed)
Patient called to request a med refill for pantoprazole (PROTONIX) 40 MG tablet , patient uses our pharmacy. Patient states that she will be going out of town and would like the refill for tomorrow. Please f/u

## 2014-08-29 MED ORDER — PANTOPRAZOLE SODIUM 40 MG PO TBEC: 40.0000 mg | DELAYED_RELEASE_TABLET | Freq: Every day | ORAL | Status: DC

## 2015-04-24 ENCOUNTER — Other Ambulatory Visit: Payer: Self-pay | Admitting: Family Medicine

## 2015-06-23 ENCOUNTER — Other Ambulatory Visit: Payer: Self-pay | Admitting: Internal Medicine

## 2015-08-08 ENCOUNTER — Other Ambulatory Visit: Payer: Self-pay | Admitting: Internal Medicine

## 2015-08-20 ENCOUNTER — Other Ambulatory Visit: Payer: Self-pay | Admitting: *Deleted

## 2015-08-20 MED ORDER — NEBIVOLOL HCL 5 MG PO TABS: 5.0000 mg | ORAL_TABLET | Freq: Every day | ORAL | Status: DC

## 2015-08-20 NOTE — Telephone Encounter (Signed)
error 

## 2015-08-20 NOTE — Telephone Encounter (Signed)
PASS PROGRAM 

## 2017-03-28 ENCOUNTER — Encounter: Payer: Self-pay | Admitting: Nurse Practitioner

## 2017-03-28 ENCOUNTER — Ambulatory Visit: Payer: Self-pay | Attending: Nurse Practitioner | Admitting: Nurse Practitioner

## 2017-03-28 VITALS — BP 108/72 | HR 71 | Temp 98.4°F | Ht 60.0 in | Wt 147.0 lb

## 2017-03-28 DIAGNOSIS — F17200 Nicotine dependence, unspecified, uncomplicated: Secondary | ICD-10-CM

## 2017-03-28 DIAGNOSIS — I10 Essential (primary) hypertension: Secondary | ICD-10-CM | POA: Insufficient documentation

## 2017-03-28 DIAGNOSIS — R7303 Prediabetes: Secondary | ICD-10-CM | POA: Insufficient documentation

## 2017-03-28 DIAGNOSIS — E782 Mixed hyperlipidemia: Secondary | ICD-10-CM | POA: Insufficient documentation

## 2017-03-28 DIAGNOSIS — Z882 Allergy status to sulfonamides status: Secondary | ICD-10-CM | POA: Insufficient documentation

## 2017-03-28 DIAGNOSIS — Z716 Tobacco abuse counseling: Secondary | ICD-10-CM | POA: Insufficient documentation

## 2017-03-28 DIAGNOSIS — K219 Gastro-esophageal reflux disease without esophagitis: Secondary | ICD-10-CM | POA: Insufficient documentation

## 2017-03-28 DIAGNOSIS — Z87898 Personal history of other specified conditions: Secondary | ICD-10-CM

## 2017-03-28 DIAGNOSIS — Z9049 Acquired absence of other specified parts of digestive tract: Secondary | ICD-10-CM | POA: Insufficient documentation

## 2017-03-28 DIAGNOSIS — Z888 Allergy status to other drugs, medicaments and biological substances status: Secondary | ICD-10-CM | POA: Insufficient documentation

## 2017-03-28 DIAGNOSIS — M797 Fibromyalgia: Secondary | ICD-10-CM | POA: Insufficient documentation

## 2017-03-28 DIAGNOSIS — E559 Vitamin D deficiency, unspecified: Secondary | ICD-10-CM | POA: Insufficient documentation

## 2017-03-28 DIAGNOSIS — Z79899 Other long term (current) drug therapy: Secondary | ICD-10-CM | POA: Insufficient documentation

## 2017-03-28 DIAGNOSIS — Z590 Homelessness: Secondary | ICD-10-CM | POA: Insufficient documentation

## 2017-03-28 DIAGNOSIS — E079 Disorder of thyroid, unspecified: Secondary | ICD-10-CM | POA: Insufficient documentation

## 2017-03-28 DIAGNOSIS — Z7984 Long term (current) use of oral hypoglycemic drugs: Secondary | ICD-10-CM | POA: Insufficient documentation

## 2017-03-28 DIAGNOSIS — F17210 Nicotine dependence, cigarettes, uncomplicated: Secondary | ICD-10-CM | POA: Insufficient documentation

## 2017-03-28 DIAGNOSIS — F329 Major depressive disorder, single episode, unspecified: Secondary | ICD-10-CM | POA: Insufficient documentation

## 2017-03-28 LAB — GLUCOSE, POCT (MANUAL RESULT ENTRY): POC Glucose: 109 mg/dl — AB (ref 70–99)

## 2017-03-28 LAB — POCT GLYCOSYLATED HEMOGLOBIN (HGB A1C): Hemoglobin A1C: 5.5

## 2017-03-28 MED ORDER — NEBIVOLOL HCL 5 MG PO TABS: 5.0000 mg | ORAL_TABLET | Freq: Every day | ORAL | 0 refills | Status: DC

## 2017-03-28 MED ORDER — PANTOPRAZOLE SODIUM 40 MG PO TBEC: 40.0000 mg | DELAYED_RELEASE_TABLET | Freq: Every day | ORAL | 2 refills | Status: DC

## 2017-03-28 MED ORDER — ATORVASTATIN CALCIUM 20 MG PO TABS: 20.0000 mg | ORAL_TABLET | Freq: Every day | ORAL | 0 refills | Status: DC

## 2017-03-28 MED ORDER — METFORMIN HCL 500 MG PO TABS: 500.0000 mg | ORAL_TABLET | Freq: Every day | ORAL | 0 refills | Status: DC

## 2017-03-28 NOTE — Patient Instructions (Signed)
Prediabetes Prediabetes is the condition of having a blood sugar (blood glucose) level that is higher than it should be, but not high enough for you to be diagnosed with type 2 diabetes. Having prediabetes puts you at risk for developing type 2 diabetes (type 2 diabetes mellitus). Prediabetes may be called impaired glucose tolerance or impaired fasting glucose. Prediabetes usually does not cause symptoms. Your health care provider can diagnose this condition with blood tests. You may be tested for prediabetes if you are overweight and if you have at least one other risk factor for prediabetes. Risk factors for prediabetes include:  Having a family member with type 2 diabetes.  Being overweight or obese.  Being older than age 57.  Being of American-Indian, African-American, Hispanic/Latino, or Asian/Pacific Islander descent.  Having an inactive (sedentary) lifestyle.  Having a history of gestational diabetes or polycystic ovarian syndrome (PCOS).  Having low levels of good cholesterol (HDL-C) or high levels of blood fats (triglycerides).  Having high blood pressure.  What is blood glucose and how is blood glucose measured?  Blood glucose refers to the amount of glucose in your bloodstream. Glucose comes from eating foods that contain sugars and starches (carbohydrates) that the body breaks down into glucose. Your blood glucose level may be measured in mg/dL (milligrams per deciliter) or mmol/L (millimoles per liter).Your blood glucose may be checked with one or more of the following blood tests:  A fasting blood glucose (FBG) test. You will not be allowed to eat (you will fast) for at least 8 hours before a blood sample is taken. ? A normal range for FBG is 70-100 mg/dl (3.9-5.6 mmol/L).  An A1c (hemoglobin A1c) blood test. This test provides information about blood glucose control over the previous 2?68month.  An oral glucose tolerance test (OGTT). This test measures your blood  glucose twice: ? After fasting. This is your baseline level. ? Two hours after you drink a beverage that contains glucose.  You may be diagnosed with prediabetes:  If your FBG is 100?125 mg/dL (5.6-6.9 mmol/L).  If your A1c level is 5.7?6.4%.  If your OGGT result is 140?199 mg/dL (7.8-11 mmol/L).  These blood tests may be repeated to confirm your diagnosis. What happens if blood glucose is too high? The pancreas produces a hormone (insulin) that helps move glucose from the bloodstream into cells. When cells in the body do not respond properly to insulin that the body makes (insulin resistance), excess glucose builds up in the blood instead of going into cells. As a result, high blood glucose (hyperglycemia) can develop, which can cause many complications. This is a symptom of prediabetes. What can happen if blood glucose stays higher than normal for a long time? Having high blood glucose for a long time is dangerous. Too much glucose in your blood can damage your nerves and blood vessels. Long-term damage can lead to complications from diabetes, which may include:  Heart disease.  Stroke.  Blindness.  Kidney disease.  Depression.  Poor circulation in the feet and legs, which could lead to surgical removal (amputation) in severe cases.  How can prediabetes be prevented from turning into type 2 diabetes?  To help prevent type 2 diabetes, take the following actions:  Be physically active. ? Do moderate-intensity physical activity for at least 30 minutes on at least 5 days of the week, or as much as told by your health care provider. This could be brisk walking, biking, or water aerobics. ? Ask your health care provider what  activities are safe for you. A mix of physical activities may be best, such as walking, swimming, cycling, and strength training.  Lose weight as told by your health care provider. ? Losing 5-7% of your body weight can reverse insulin resistance. ? Your health  care provider can determine how much weight loss is best for you and can help you lose weight safely.  Follow a healthy meal plan. This includes eating lean proteins, complex carbohydrates, fresh fruits and vegetables, low-fat dairy products, and healthy fats. ? Follow instructions from your health care provider about eating or drinking restrictions. ? Make an appointment to see a diet and nutrition specialist (registered dietitian) to help you create a healthy eating plan that is right for you.  Do not smoke or use any tobacco products, such as cigarettes, chewing tobacco, and e-cigarettes. If you need help quitting, ask your health care provider.  Take over-the-counter and prescription medicines as told by your health care provider. You may be prescribed medicines that help lower the risk of type 2 diabetes.  This information is not intended to replace advice given to you by your health care provider. Make sure you discuss any questions you have with your health care provider. Document Released: 06/09/2015 Document Revised: 07/24/2015 Document Reviewed: 04/08/2015 Elsevier Interactive Patient Education  2018 [employer]. Prediabetes Eating Plan Prediabetes-also called impaired glucose tolerance or impaired fasting glucose-is a condition that causes blood sugar (blood glucose) levels to be higher than normal. Following a healthy diet can help to keep prediabetes under control. It can also help to lower the risk of type 2 diabetes and heart disease, which are increased in people who have prediabetes. Along with regular exercise, a healthy diet:  Promotes weight loss.  Helps to control blood sugar levels.  Helps to improve the way that the body uses insulin.  What do I need to know about this eating plan?  Use the glycemic index (GI) to plan your meals. The index tells you how quickly a food will raise your blood sugar. Choose low-GI foods. These foods take a longer time to raise blood  sugar.  Pay close attention to the amount of carbohydrates in the food that you eat. Carbohydrates increase blood sugar levels.  Keep track of how many calories you take in. Eating the right amount of calories will help you to achieve a healthy weight. Losing about 7 percent of your starting weight can help to prevent type 2 diabetes.  You may want to follow a Mediterranean diet. This diet includes a lot of vegetables, lean meats or fish, whole grains, fruits, and healthy oils and fats. What foods can I eat? Grains Whole grains, such as whole-wheat or whole-grain breads, crackers, cereals, and pasta. Unsweetened oatmeal. Bulgur. Barley. Quinoa. Brown rice. Corn or whole-wheat flour tortillas or taco shells. Vegetables Lettuce. Spinach. Peas. Beets. Cauliflower. Cabbage. Broccoli. Carrots. Tomatoes. Squash. Eggplant. Herbs. Peppers. Onions. Cucumbers. Brussels sprouts. Fruits Berries. Bananas. Apples. Oranges. Grapes. Papaya. Mango. Pomegranate. Kiwi. Grapefruit. Cherries. Meats and Other Protein Sources Seafood. Lean meats, such as chicken and [place] or lean cuts of pork and beef. Tofu. Eggs. Nuts. Beans. Dairy Low-fat or fat-free dairy products, such as yogurt, cottage cheese, and cheese. Beverages Water. Tea. Coffee. Sugar-free or diet soda. Seltzer water. Milk. Milk alternatives, such as soy or almond milk. Condiments Mustard. Relish. Low-fat, low-sugar ketchup. Low-fat, low-sugar barbecue sauce. Low-fat or fat-free mayonnaise. Sweets and Desserts Sugar-free or low-fat pudding. Sugar-free or low-fat ice cream and other frozen treats.  Fats and Oils Avocado. Walnuts. Olive oil. The items listed above may not be a complete list of recommended foods or beverages. Contact your dietitian for more options. What foods are not recommended? Grains Refined white flour and flour products, such as bread, pasta, snack foods, and cereals. Beverages Sweetened drinks, such as sweet iced tea and  soda. Sweets and Desserts Baked goods, such as cake, cupcakes, pastries, cookies, and cheesecake. The items listed above may not be a complete list of foods and beverages to avoid. Contact your dietitian for more information. This information is not intended to replace advice given to you by your health care provider. Make sure you discuss any questions you have with your health care provider. Document Released: 07/02/2014 Document Revised: 07/24/2015 Document Reviewed: 03/13/2014 Elsevier Interactive Patient Education  2017 Else[employer] Inc.  Preventing Type 2 Diabetes Mellitus Type 2 diabetes (type 2 diabetes mellitus) is a long-term (chronic) disease that affects blood sugar (glucose) levels. Normally, a hormone called insulin allows glucose to enter cells in the body. The cells use glucose for energy. In type 2 diabetes, one or both of these problems may be present:  The body does not make enough insulin.  The body does not respond properly to insulin that it makes (insulin resistance).  Insulin resistance or lack of insulin causes excess glucose to build up in the blood instead of going into cells. As a result, high blood glucose (hyperglycemia) develops, which can cause many complications. Being overweight or obese and having an inactive (sedentary) lifestyle can increase your risk for diabetes. Type 2 diabetes can be delayed or prevented by making certain nutrition and lifestyle changes. What nutrition changes can be made?  Eat healthy meals and snacks regularly. Keep a healthy snack with you for when you get hungry between meals, such as fruit or a handful of nuts.  Eat lean meats and proteins that are low in saturated fats, such as chicken, fish, egg whites, and beans. Avoid processed meats.  Eat plenty of fruits and vegetables and plenty of grains that have not been processed (whole grains). It is recommended that you eat: ? 1?2 cups of fruit every day. ? 2?3 cups of vegetables every  day. ? 6?8 oz of whole grains every day, such as oats, whole wheat, bulgur, brown rice, quinoa, and millet.  Eat low-fat dairy products, such as milk, yogurt, and cheese.  Eat foods that contain healthy fats, such as nuts, avocado, olive oil, and canola oil.  Drink water throughout the day. Avoid drinks that contain added sugar, such as soda or sweet tea.  Follow instructions from your health care provider about specific eating or drinking restrictions.  Control how much food you eat at a time (portion size). ? Check food labels to find out the serving sizes of foods. ? Use a kitchen scale to weigh amounts of foods.  Saute or steam food instead of frying it. Cook with water or broth instead of oils or butter.  Limit your intake of: ? Salt (sodium). Have no more than 1 tsp (2,400 mg) of sodium a day. If you have heart disease or high blood pressure, have less than ? tsp (1,500 mg) of sodium a day. ? Saturated fat. This is fat that is solid at room temperature, such as butter or fat on meat. What lifestyle changes can be made?  Activity  Do moderate-intensity physical activity for at least 30 minutes on at least 5 days of the week, or as much as told  by your health care provider.  Ask your health care provider what activities are safe for you. A mix of physical activities may be best, such as walking, swimming, cycling, and strength training.  Try to add physical activity into your day. For example: ? Park in spots that are farther away than usual, so that you walk more. For example, park in a far corner of the parking lot when you go to the office or the grocery store. ? Take a walk during your lunch break. ? Use stairs instead of elevators or escalators. Weight Loss  Lose weight as directed. Your health care provider can determine how much weight loss is best for you and can help you lose weight safely.  If you are overweight or obese, you may be instructed to lose at least 5?7  % of your body weight. Alcohol and Tobacco   Limit alcohol intake to no more than 1 drink a day for nonpregnant women and 2 drinks a day for men. One drink equals 12 oz of beer, 5 oz of wine, or 1 oz of hard liquor.  Do not use any tobacco products, such as cigarettes, chewing tobacco, and e-cigarettes. If you need help quitting, ask your health care provider. Work With [place] Provider  Have your blood glucose tested regularly, as told by your health care provider.  Discuss your risk factors and how you can reduce your risk for diabetes.  Get screening tests as told by your health care provider. You may have screening tests regularly, especially if you have certain risk factors for type 2 diabetes.  Make an appointment with a diet and nutrition specialist (registered dietitian). A registered dietitian can help you make a healthy eating plan and can help you understand portion sizes and food labels. Why are these changes important?  It is possible to prevent or delay type 2 diabetes and related health problems by making lifestyle and nutrition changes.  It can be difficult to recognize signs of type 2 diabetes. The best way to avoid possible damage to your body is to take actions to prevent the disease before you develop symptoms. What can happen if changes are not made?  Your blood glucose levels may keep increasing. Having high blood glucose for a long time is dangerous. Too much glucose in your blood can damage your blood vessels, heart, kidneys, nerves, and eyes.  You may develop prediabetes or type 2 diabetes. Type 2 diabetes can lead to many chronic health problems and complications, such as: ? Heart disease. ? Stroke. ? Blindness. ? Kidney disease. ? Depression. ? Poor circulation in the feet and legs, which could lead to surgical removal (amputation) in severe cases. Where to find support:  Ask your health care provider to recommend a registered dietitian, diabetes  educator, or weight loss program.  Look for local or online weight loss groups.  Join a gym, fitness club, or outdoor activity group, such as a walking club. Where to find more information: To learn more about diabetes and diabetes prevention, visit:  American Diabetes Association (ADA): www.diabetes.[employer] of Diabetes and Digestive and Kidney Diseases: FindSpin.nl  To learn more about healthy eating, visit:  The U.S. Department of Agriculture Scientist, research (physical sciences)), Choose My Plate: http://wiley-williams.com/  Office of Disease Prevention and Health Promotion (ODPHP), Dietary Guidelines: SurferLive.at  Summary  You can reduce your risk for type 2 diabetes by increasing your physical activity, eating healthy foods, and losing weight as directed.  Talk with your  health care provider about your risk for type 2 diabetes. Ask about any blood tests or screening tests that you need to have. This information is not intended to replace advice given to you by your health care provider. Make sure you discuss any questions you have with your health care provider. Document Released: 06/09/2015 Document Revised: 07/24/2015 Document Reviewed: 04/08/2015 Elsevier Interactive Patient Education  2018 Elsevier Inc.  

## 2017-03-28 NOTE — Progress Notes (Signed)
Assessment & Plan:  Brandan was seen today for establish care.  Diagnoses and all orders for this visit:  History of prediabetes -     Microalbumin / creatinine urine ratio -     HgB A1c -     Glucose (CBG) -     metFORMIN (GLUCOPHAGE) 500 MG tablet; Take 1 tablet (500 mg total) by mouth daily with breakfast.   Mixed hyperlipidemia -     atorvastatin (LIPITOR) 20 MG tablet; Take 1 tablet (20 mg total) by mouth daily. INSTRUCTIONS:  Work on a low fat, heart healthy diet and participate in regular aerobic exercise program to control as well by working out at least 150 minutes per week. No fried foods. No junk foods, sodas, sugary drinks, unhealthy snacking, or smoking.    Essential hypertension -     nebivolol (BYSTOLIC) 5 MG tablet; Take 1 tablet (5 mg total) by mouth daily. Continue all antihypertensives as prescribed.  Remember to bring in your blood pressure log with you for your follow up appointment.  DASH/Mediterranean Diets are healthier choices for HTN.   Tobacco dependence Mairi was counseled on the dangers of tobacco use, and was advised to quit. Reviewed strategies to maximize success, including removing cigarettes and smoking materials from environment, stress management and support of family/friends as well as pharmacological alternatives including: Wellbutrin, Chantix, Nicotine patch, Nicotine gum or lozenges. Smoking cessation support: smoking cessation hotline: 1-800-QUIT-NOW.  Smoking cessation classes are also available through [facility]. Call 7604080776 or visit our website at [email or web address]   Spent 3 minutes counseling on smoking cessation and patient is not ready to quit.  Gastroesophageal reflux disease, esophagitis presence not specified -     pantoprazole (PROTONIX) 40 MG tablet; Take 1 tablet (40 mg total) by mouth daily.  INSTRUCTIONS: Avoid GERD Triggers: acidic, spicy or fried foods, caffeine, coffee, sodas,   alcohol and chocolate.    Patient has been counseled on age-appropriate routine health concerns for screening and prevention. These are reviewed and up-to-date. Referrals have been placed accordingly. Immunizations are up-to-date or declined.    Subjective:   Chief Complaint  Patient presents with  . Establish Care    Patient is here to establish care. Patient stated her left eye for sore with discomfort. Patient request medication refills.    HPI Rose Robertson 57 y.o. female presents to office today to establish care. She has a history of prediabetes, HTN, HPL, GERD, Fibromyalgia with Depression, and she is a smoker.  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  She has 100% health insurance in [place].   Prediabetes She can not recall what her last a1c was. She was placed on metformin and told she had diabetes mellitus type 2.  However I am unable to locate an A1c greater than 5.8. She has been taking metformin 500mg  qd instead of BID. She does not monitor her blood sugars at home. She denies any symptoms of hypo or hyperglycemia.   Lab Results  Component Value Date   HGBA1C 5.5 03/28/2017    Essential Hypertension She reports a history of palpitations, tachycardia (up to 130s) with hypertension. She has been on bystolic since 1761. She travels back and forth from [place] to [place] and receives most of her health care when she returns to her home country as she receives free health care there including seeing specialists.  She plans to see her cardiologist in [place] in a  few weeks where she will have extensive testing performed including EKG/ECHO and Stress test per her report. She takes bystolic.She has a significant maternal family history of heart disease. Denies chest pain,  palpitations, lightheadedness, dizziness, headaches or BLE edema.   Tobacco Dependence Smokes 5-6 cigarettes per day. She does not think this is excessive and she is not ready  to quit.    Vitamin D deficiency She takes 5000units of vitamin D daily for vitamin d deficiency. Will repeat labs.    Breast Implants She had biotoxins and was taking questran for detox but couldn't continue to afford the medications so she had to stop taking it. Endorses mold in her breasts which caused her to become very ill 2008. In 2010 she had her implants removed and reconstructive surgery.   Mixed Hyperlipidemia Takes lipitor as prescribed. Denies statin intolerance or myalgias. Also taking omega 3 fatty acids.    ROS  Past Medical History:  Diagnosis Date  . Anal fissure   . Asthma Dx 1991   no inhaler  . C. difficile diarrhea   . Depression    At age of 71  . Diabetes mellitus without complication ([place]) Dx 1610  . Dysrhythmia    tachycardia -controlled by med  . Fibromyalgia Dx 2010  . GERD (gastroesophageal reflux disease) Dx 2008  . Hyperlipidemia Dx 2010  . Hypertension Dx 2010  . Infection of breast implant ([place]) [id number]   staph and fungemia. breat removed in 2012 in [place]   . Pancreatitis   . Pneumonia   . Spinal headache 1990   BP dropped after spinal anes.  . 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 Tailbone injury 1990   broken   . Thyroid disease Dx 2010  . Urinary incontinence, stress     Past Surgical History:  Procedure Laterality Date  . ABDOMINOPLASTY  1997  . ANAL FISSURE REPAIR  1985  . APPENDECTOMY  1987  . BREAST BIOPSY  2011  . BREAST ENHANCEMENT SURGERY  1997  . BREAST IMPLANT REMOVAL  6/12   implants and benign breast lumps removed  . CHOLECYSTECTOMY  1991  . LAPAROSCOPIC ASSISTED VAGINAL HYSTERECTOMY  12/20/2011   Bilateral Salpingo-Oophorectomy  . TONSILLECTOMY  1978    Family History  Problem Relation Age of Onset  . Coronary artery disease Mother 63  . Hypertension Mother   . Stroke Mother   . Hypertension Father   . Diabetes Father        type 2  . Dementia Father   .  Stroke Father        deceased, passed away had over 100 mini strokes  . Ovarian cancer Maternal Aunt 80  . Colon polyps Sister   . Diabetes Paternal Uncle   . Diabetes Paternal Grandmother     Social History Reviewed with no changes to be made today.   Outpatient Medications Prior to Visit  Medication Sig Dispense Refill  . cholecalciferol (VITAMIN D) 1000 UNITS tablet Take 5,000 Units by mouth daily.     . fish oil-omega-3 fatty acids 1000 MG capsule Take 1,200 mg by mouth daily.    Marland Kitchen atorvastatin (LIPITOR) 20 MG tablet Take 20 mg by mouth daily.    . metFORMIN (GLUCOPHAGE) 500 MG tablet Take 500 mg by mouth 2 (two) times daily with a meal. Per Dr brown    . nebivolol (BYSTOLIC) 5 MG tablet Take 1 tablet (5 mg total) by mouth  daily. Needs office visit for refills 90 tablet 3  . pantoprazole (PROTONIX) 40 MG tablet TAKE 1 TABLET BY MOUTH DAILY 30 tablet 2  . milk thistle 175 MG tablet Take 175 mg by mouth daily.    Marland Kitchen NALTREXONE HCL PO Take 4.5 mg by mouth at bedtime. Uses Naltrexone 4.5mg  #3    . naproxen (NAPROSYN) 500 MG tablet Take 1 tablet (500 mg total) by mouth 2 (two) times daily with a meal. For 5 days for hand pain and joint swelling (Patient not taking: Reported on 03/28/2017) 30 tablet 0  . OREGANO PO Take 460 mg by mouth 2 (two) times daily.    . cholestyramine (QUESTRAN) 4 G packet Take 1 packet by mouth 3 (three) times daily with meals.    . NON FORMULARY Apply topically daily. Bi-EST 60-40mg   HRT cream    . NONFORMULARY OR COMPOUNDED ITEM Compounded T3    . NONFORMULARY OR COMPOUNDED ITEM 1 [place] by Submucosal route daily. MMB #6 5-40mg     . NONFORMULARY OR COMPOUNDED ITEM Take 580 mg by mouth daily. Cell Gevity     No facility-administered medications prior to visit.     Allergies  Allergen Reactions  . Sulfa Antibiotics     Remo Lipps Johnson's syndrome  . Adhesive [Tape] Other (See Comments)    Blisters   . Latex     rash  . Pineapple Flavor        Objective:     BP 108/72 (BP Location: Left Arm, Patient Position: Sitting, Cuff Size: Normal)   Pulse 71   Temp 98.4 F (36.9 C) (Oral)   Ht 5' (1.524 m)   Wt 147 lb (66.7 kg)   LMP 12/05/2011   SpO2 98%   BMI 28.71 kg/m  Wt Readings from Last 3 Encounters:  03/28/17 147 lb (66.7 kg)  05/30/14 158 lb (71.7 kg)  05/29/14 158 lb (71.7 kg)    Physical Exam       Patient has been counseled extensively about nutrition and exercise as well as the importance of adherence with medications and regular follow-up. The patient was given clear instructions to go to ER or return to medical center if symptoms don't improve, worsen or new problems develop. The patient verbalized understanding.   Follow-up: Return in about 3 months (around 06/26/2017) for HTN/HPL/DM.   Gildardo Pounds, FNP-BC [facility] and [place], [place]   03/30/2017, 7:14 PM

## 2017-03-29 LAB — MICROALBUMIN / CREATININE URINE RATIO
Creatinine, Urine: 16 mg/dL
Microalb/Creat Ratio: <18.8 mg/g creat (ref 0.0–30.0)
Microalbumin, Urine: <3 ug/mL

## 2017-03-30 ENCOUNTER — Encounter: Payer: Self-pay | Admitting: Nurse Practitioner

## 2017-03-31 ENCOUNTER — Telehealth: Payer: Self-pay

## 2017-03-31 NOTE — Telephone Encounter (Signed)
-----   Message from Gildardo Pounds, NP sent at 03/30/2017  7:59 PM EST ----- Your micro albumin urine test for diabetic complications affecting the kidneys was normal.

## 2017-03-31 NOTE — Telephone Encounter (Signed)
[name] called patient informing on lab results.  Patient understood.

## 2017-04-04 ENCOUNTER — Ambulatory Visit: Payer: Self-pay | Attending: Nurse Practitioner

## 2017-06-27 ENCOUNTER — Ambulatory Visit: Payer: Self-pay | Admitting: Nurse Practitioner

## 2017-06-27 ENCOUNTER — Other Ambulatory Visit: Payer: Self-pay | Admitting: Nurse Practitioner

## 2017-06-27 DIAGNOSIS — I10 Essential (primary) hypertension: Secondary | ICD-10-CM

## 2017-06-27 DIAGNOSIS — K219 Gastro-esophageal reflux disease without esophagitis: Secondary | ICD-10-CM

## 2017-06-27 DIAGNOSIS — Z87898 Personal history of other specified conditions: Secondary | ICD-10-CM

## 2017-06-27 DIAGNOSIS — E782 Mixed hyperlipidemia: Secondary | ICD-10-CM

## 2017-08-15 ENCOUNTER — Encounter: Payer: Self-pay | Admitting: Nurse Practitioner

## 2017-08-15 ENCOUNTER — Ambulatory Visit: Payer: Self-pay | Attending: Nurse Practitioner | Admitting: Nurse Practitioner

## 2017-08-15 VITALS — BP 136/89 | Temp 98.8°F | Ht 60.0 in | Wt 150.0 lb

## 2017-08-15 DIAGNOSIS — R399 Unspecified symptoms and signs involving the genitourinary system: Secondary | ICD-10-CM

## 2017-08-15 DIAGNOSIS — I10 Essential (primary) hypertension: Secondary | ICD-10-CM | POA: Insufficient documentation

## 2017-08-15 DIAGNOSIS — M5440 Lumbago with sciatica, unspecified side: Secondary | ICD-10-CM

## 2017-08-15 DIAGNOSIS — F329 Major depressive disorder, single episode, unspecified: Secondary | ICD-10-CM | POA: Insufficient documentation

## 2017-08-15 DIAGNOSIS — R300 Dysuria: Secondary | ICD-10-CM | POA: Insufficient documentation

## 2017-08-15 DIAGNOSIS — G8929 Other chronic pain: Secondary | ICD-10-CM | POA: Insufficient documentation

## 2017-08-15 DIAGNOSIS — R339 Retention of urine, unspecified: Secondary | ICD-10-CM | POA: Insufficient documentation

## 2017-08-15 DIAGNOSIS — K219 Gastro-esophageal reflux disease without esophagitis: Secondary | ICD-10-CM | POA: Insufficient documentation

## 2017-08-15 DIAGNOSIS — M5441 Lumbago with sciatica, right side: Secondary | ICD-10-CM

## 2017-08-15 DIAGNOSIS — E079 Disorder of thyroid, unspecified: Secondary | ICD-10-CM | POA: Insufficient documentation

## 2017-08-15 DIAGNOSIS — Z7984 Long term (current) use of oral hypoglycemic drugs: Secondary | ICD-10-CM | POA: Insufficient documentation

## 2017-08-15 DIAGNOSIS — Z8371 Family history of colonic polyps: Secondary | ICD-10-CM | POA: Insufficient documentation

## 2017-08-15 DIAGNOSIS — M5442 Lumbago with sciatica, left side: Secondary | ICD-10-CM | POA: Insufficient documentation

## 2017-08-15 DIAGNOSIS — F3289 Other specified depressive episodes: Secondary | ICD-10-CM

## 2017-08-15 DIAGNOSIS — M5116 Intervertebral disc disorders with radiculopathy, lumbar region: Secondary | ICD-10-CM | POA: Insufficient documentation

## 2017-08-15 DIAGNOSIS — Z882 Allergy status to sulfonamides status: Secondary | ICD-10-CM | POA: Insufficient documentation

## 2017-08-15 DIAGNOSIS — N399 Disorder of urinary system, unspecified: Secondary | ICD-10-CM | POA: Insufficient documentation

## 2017-08-15 DIAGNOSIS — M797 Fibromyalgia: Secondary | ICD-10-CM | POA: Insufficient documentation

## 2017-08-15 DIAGNOSIS — E785 Hyperlipidemia, unspecified: Secondary | ICD-10-CM | POA: Insufficient documentation

## 2017-08-15 DIAGNOSIS — J45909 Unspecified asthma, uncomplicated: Secondary | ICD-10-CM | POA: Insufficient documentation

## 2017-08-15 DIAGNOSIS — Z79899 Other long term (current) drug therapy: Secondary | ICD-10-CM | POA: Insufficient documentation

## 2017-08-15 DIAGNOSIS — E119 Type 2 diabetes mellitus without complications: Secondary | ICD-10-CM | POA: Insufficient documentation

## 2017-08-15 LAB — POCT URINALYSIS DIPSTICK
Bilirubin, UA: NEGATIVE
Blood, UA: NEGATIVE
Glucose, UA: NEGATIVE
Ketones, UA: NEGATIVE
Leukocytes, UA: NEGATIVE
Nitrite, UA: NEGATIVE
Protein, UA: NEGATIVE
Spec Grav, UA: <=1.005 — AB (ref 1.010–1.025)
Urobilinogen, UA: 0.2 E.U./dL
pH, UA: 7 (ref 5.0–8.0)

## 2017-08-15 MED ORDER — ESCITALOPRAM OXALATE 20 MG PO TABS: 20.0000 mg | ORAL_TABLET | Freq: Every day | ORAL | 1 refills | Status: DC

## 2017-08-15 MED ORDER — GABAPENTIN 300 MG PO CAPS
300.0000 mg | ORAL_CAPSULE | Freq: Three times a day (TID) | ORAL | 3 refills | Status: DC
Start: 2017-08-15 — End: 2018-02-13

## 2017-08-15 MED ORDER — PHENAZOPYRIDINE HCL 95 MG PO TABS: 95.0000 mg | ORAL_TABLET | Freq: Three times a day (TID) | ORAL | 0 refills | Status: DC | PRN
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
## 2017-08-15 NOTE — Patient Instructions (Signed)
Diabetes blood sugar goals  Fasting in AM before breakfast which means at least 8 hrs of no eating or drinking) except water or unsweetened coffee or tea): 90-110 2 hrs after meals: < 160,   Hypoglycemia or low blood sugar: < 70 (You should not have hypoglycemia.)  Aim for 30 minutes of exercise most days. Rethink what you drink. Water is great! Aim for 2-3 Carb Choices per meal (30-45 grams) +/- 1 either way  Aim for 0-15 Carbs per snack if hungry  Include protein in moderation with your meals and snacks  Consider reading food labels for Total Carbohydrate and Fat Grams of foods  Consider checking BG at alternate times per day  Continue taking medication as directed Be mindful about how much sugar you are adding to beverages and other foods. Fruit Punch - find one with no sugar  Measure and decrease portions of carbohydrate foods  Make your plate and don't go back for seconds 

## 2017-08-16 LAB — URINALYSIS, COMPLETE
Bilirubin, UA: NEGATIVE
Glucose, UA: NEGATIVE
Ketones, UA: NEGATIVE
Leukocytes, UA: NEGATIVE
Nitrite, UA: NEGATIVE
Protein, UA: NEGATIVE
RBC, UA: NEGATIVE
Specific Gravity, UA: <=1.005 — AB (ref 1.005–1.030)
Urobilinogen, Ur: 0.2 mg/dL (ref 0.2–1.0)
pH, UA: 6.5 (ref 5.0–7.5)

## 2017-08-16 LAB — URINE CYTOLOGY ANCILLARY ONLY
Chlamydia: NEGATIVE
Neisseria Gonorrhea: NEGATIVE
Trichomonas: NEGATIVE

## 2017-08-16 LAB — MICROSCOPIC EXAMINATION
Bacteria, UA: NONE SEEN
Casts: NONE SEEN /lpf
Epithelial Cells (non renal): NONE SEEN /hpf (ref 0–10)
RBC, UA: NONE SEEN /hpf (ref 0–2)
WBC, UA: NONE SEEN /hpf (ref 0–5)

## 2017-08-17 LAB — URINE CULTURE

## 2017-08-18 ENCOUNTER — Telehealth: Payer: Self-pay

## 2017-08-18 LAB — URINE CYTOLOGY ANCILLARY ONLY
Bacterial vaginitis: NEGATIVE
Candida vaginitis: NEGATIVE

## 2017-08-18 NOTE — Telephone Encounter (Signed)
[name] spoke to patient to inform on lab results.  Patient understood.

## 2017-08-18 NOTE — Telephone Encounter (Signed)
-----   Message from Gildardo Pounds, NP sent at 08/17/2017  9:01 AM EDT ----- Culture did not show any bacteria.

## 2017-08-18 NOTE — Telephone Encounter (Signed)
-----   Message from Gildardo Pounds, NP sent at 08/16/2017 11:10 PM EDT ----- Urine cytology does not show any UTI, gonorrhea, chlamydia, or trichomonas. Awaiting additional testing

## 2017-08-25 ENCOUNTER — Telehealth: Payer: Self-pay

## 2017-08-25 NOTE — Telephone Encounter (Signed)
-----   Message from Gildardo Pounds, NP sent at 08/25/2017  9:35 AM EDT ----- Urine cytology negative for bacteria in the vagina or yeast.

## 2017-08-25 NOTE — Telephone Encounter (Signed)
[name] spoke to patient to inform on lab results.  Pt. Understood. Pt. Stated she figured out what was causing her discomfort. Pt. Stated it was the pad she was using that had chemical she was allergic to, and she stop using the pad, she is no longer having the discomfort.

## 2017-09-07 ENCOUNTER — Encounter: Payer: Self-pay | Admitting: Nurse Practitioner

## 2017-09-07 ENCOUNTER — Ambulatory Visit: Payer: Self-pay | Attending: Nurse Practitioner | Admitting: Nurse Practitioner

## 2017-09-07 VITALS — BP 123/79 | HR 68 | Temp 98.6°F | Ht 60.0 in | Wt 150.6 lb

## 2017-09-07 DIAGNOSIS — E785 Hyperlipidemia, unspecified: Secondary | ICD-10-CM | POA: Insufficient documentation

## 2017-09-07 DIAGNOSIS — N941 Unspecified dyspareunia: Secondary | ICD-10-CM | POA: Insufficient documentation

## 2017-09-07 DIAGNOSIS — Z882 Allergy status to sulfonamides status: Secondary | ICD-10-CM | POA: Insufficient documentation

## 2017-09-07 DIAGNOSIS — Z79899 Other long term (current) drug therapy: Secondary | ICD-10-CM | POA: Insufficient documentation

## 2017-09-07 DIAGNOSIS — E119 Type 2 diabetes mellitus without complications: Secondary | ICD-10-CM | POA: Insufficient documentation

## 2017-09-07 DIAGNOSIS — I10 Essential (primary) hypertension: Secondary | ICD-10-CM | POA: Insufficient documentation

## 2017-09-07 DIAGNOSIS — E079 Disorder of thyroid, unspecified: Secondary | ICD-10-CM | POA: Insufficient documentation

## 2017-09-07 DIAGNOSIS — R590 Localized enlarged lymph nodes: Secondary | ICD-10-CM | POA: Insufficient documentation

## 2017-09-07 DIAGNOSIS — F329 Major depressive disorder, single episode, unspecified: Secondary | ICD-10-CM | POA: Insufficient documentation

## 2017-09-07 DIAGNOSIS — M797 Fibromyalgia: Secondary | ICD-10-CM | POA: Insufficient documentation

## 2017-09-07 DIAGNOSIS — F320 Major depressive disorder, single episode, mild: Secondary | ICD-10-CM

## 2017-09-07 DIAGNOSIS — K219 Gastro-esophageal reflux disease without esophagitis: Secondary | ICD-10-CM | POA: Insufficient documentation

## 2017-09-07 MED ORDER — ESCITALOPRAM OXALATE 20 MG PO TABS: 20.0000 mg | ORAL_TABLET | Freq: Every day | ORAL | 1 refills | Status: DC

## 2017-09-07 NOTE — Progress Notes (Signed)
Assessment & Plan:  Rose Robertson was seen today for follow-up.  Diagnoses and all orders for this visit:  Current mild episode of major depressive disorder without prior episode (HCC) -     escitalopram (LEXAPRO) 20 MG tablet; Take 1 tablet (20 mg total) by mouth daily.  Female dyspareunia -     Ambulatory referral to Gynecology  Axillary adenopathy -     [place] AXILLA LEFT; Future She endorses a past history of benign left axillary adenopathy. She states there has been a re occurrence of the adenopathy in the same location as the previous node.    Patient has been counseled on age-appropriate routine health concerns for screening and prevention. These are reviewed and up-to-date. Referrals have been placed accordingly. Immunizations are up-to-date or declined.    Subjective:   Chief Complaint  Patient presents with  . Follow-up    Pt. is here for follow up on lexapro. Pt. stated she is having joint pain.   HPI Rose Robertson 57 y.o. female presents to office today for follow up to  Depression. She also has complaints of dyspareunia.   Depression Chronic. Stable and well controlled. She is currently taking Lexapro 20mg . Endorses increased mood stability. Denies any current suicidal ideation.   Dyspareunia Chronic and ongoing. Libido is not affected.  Patient denies history of cranial, spinal, or pelvic trauma, pelvic radiation and urologic disease. 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 She also endorses a history of "vaginal ecoli" and was treated with antibiotics for several weeks.     Review of Systems  Constitutional: Negative for fever, malaise/fatigue and weight loss.  HENT: Negative.  Negative for nosebleeds.   Eyes: Negative.  Negative for blurred vision, double  vision and photophobia.  Respiratory: Negative.  Negative for cough and shortness of breath.   Cardiovascular: Negative.  Negative for chest pain, palpitations and leg swelling.  Gastrointestinal: Negative.  Negative for heartburn, nausea and vomiting.  Genitourinary:       SEE HPI  Musculoskeletal: Negative.  Negative for myalgias.  Neurological: Negative.  Negative for dizziness, focal weakness, seizures and headaches.  Psychiatric/Behavioral: Positive for depression. Negative for suicidal ideas.    Past Medical History:  Diagnosis Date  . Anal fissure   . Asthma Dx 1991   no inhaler  . C. difficile diarrhea   . Depression    At age of 17  . Diabetes mellitus without complication ([place]) Dx 7829  . Dysrhythmia    tachycardia -controlled by med  . Fibromyalgia Dx 2010  . GERD (gastroesophageal reflux disease) Dx 2008  . Hyperlipidemia Dx 2010  . Hypertension Dx 2010  . Infection of breast implant ([place]) [id number]   staph and fungemia. breat removed in 2012 in [place]   . Pancreatitis   . Pneumonia   . Spinal headache 1990   BP dropped after spinal anes.  . 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 Tailbone injury 1990   broken   . Thyroid disease Dx 2010  . Urinary incontinence, stress     Past Surgical History:  Procedure Laterality Date  . ABDOMINOPLASTY  1997  . ANAL FISSURE REPAIR  1985  . APPENDECTOMY  1987  . BREAST BIOPSY  2011  . BREAST ENHANCEMENT SURGERY  1997  . BREAST IMPLANT REMOVAL  6/12  implants and benign breast lumps removed  . CHOLECYSTECTOMY  1991  . LAPAROSCOPIC ASSISTED VAGINAL HYSTERECTOMY  12/20/2011   Bilateral Salpingo-Oophorectomy  . TONSILLECTOMY  1978    Family History  Problem Relation Age of Onset  . Coronary artery disease Mother 90  . Hypertension Mother   . Stroke Mother   . Hypertension Father   . Diabetes Father        type 2  . Dementia Father   . Stroke Father        deceased,  passed away had over 100 mini strokes  . Ovarian cancer Maternal Aunt 80  . Colon polyps Sister   . Diabetes Paternal Uncle   . Diabetes Paternal Grandmother     Social History Reviewed with no changes to be made today.   Outpatient Medications Prior to Visit  Medication Sig Dispense Refill  . atorvastatin (LIPITOR) 20 MG tablet TAKE 1 TABLET BY MOUTH DAILY. 90 tablet 0  . BYSTOLIC 5 MG tablet TAKE 1 TABLET BY MOUTH DAILY. 90 tablet 0  . cholecalciferol (VITAMIN D) 1000 UNITS tablet Take 5,000 Units by mouth daily.     . Cyanocobalamin (VITAMIN B-12 PO) Take by mouth.    . fish oil-omega-3 fatty acids 1000 MG capsule Take 1,200 mg by mouth daily.    . pantoprazole (PROTONIX) 40 MG tablet TAKE 1 TABLET BY MOUTH DAILY. 30 tablet 2  . phenazopyridine (PYRIDIUM) 95 MG tablet Take 1 tablet (95 mg total) by mouth 3 (three) times daily as needed for pain. 10 tablet 0  . escitalopram (LEXAPRO) 20 MG tablet Take 1 tablet (20 mg total) by mouth daily. 30 tablet 1  . gabapentin (NEURONTIN) 300 MG capsule Take 1 capsule (300 mg total) by mouth 3 (three) times daily. (Patient not taking: Reported on 09/07/2017) 90 capsule 3  . metFORMIN (GLUCOPHAGE) 500 MG tablet TAKE 1 TABLET BY MOUTH DAILY WITH BREAKFAST. (Patient not taking: Reported on 08/15/2017) 90 tablet 0  . milk thistle 175 MG tablet Take 175 mg by mouth daily.    Marland Kitchen NALTREXONE HCL PO Take 4.5 mg by mouth at bedtime. Uses Naltrexone 4.5mg  #3    . OREGANO PO Take 460 mg by mouth 2 (two) times daily.    . naproxen (NAPROSYN) 500 MG tablet Take 1 tablet (500 mg total) by mouth 2 (two) times daily with a meal. For 5 days for hand pain and joint swelling (Patient not taking: Reported on 03/28/2017) 30 tablet 0   No facility-administered medications prior to visit.     Allergies  Allergen Reactions  . Sulfa Antibiotics     Remo Lipps Johnson's syndrome  . Adhesive [Tape] Other (See Comments)    Blisters   . Latex     rash  . Pineapple Flavor         Objective:    BP 123/79 (BP Location: Left Arm, Patient Position: Sitting, Cuff Size: Normal)   Pulse 68   Temp 98.6 F (37 C) (Oral)   Ht 5' (1.524 m)   Wt 150 lb 9.6 oz (68.3 kg)   LMP 12/05/2011   SpO2 97%   BMI 29.41 kg/m  Wt Readings from Last 3 Encounters:  09/07/17 150 lb 9.6 oz (68.3 kg)  08/15/17 150 lb (68 kg)  03/28/17 147 lb (66.7 kg)    Physical Exam  Constitutional: She is oriented to person, place, and time. She appears well-developed and well-nourished. She is cooperative.  HENT:  Head: Normocephalic and atraumatic.  Eyes:  EOM are normal.  Neck: Normal range of motion.  Cardiovascular: Normal rate, regular rhythm, normal heart sounds and intact distal pulses. Exam reveals no gallop and no friction rub.  No murmur heard. Pulmonary/Chest: Effort normal and breath sounds normal. No tachypnea. No respiratory distress. She has no decreased breath sounds. She has no wheezes. She has no rhonchi. She has no rales. She exhibits no tenderness.  Abdominal: Bowel sounds are normal.  Musculoskeletal: Normal range of motion. She exhibits no edema.  Lymphadenopathy:    She has axillary adenopathy (left arm).  Neurological: She is alert and oriented to person, place, and time. Coordination normal.  Skin: Skin is warm and dry.  Psychiatric: She has a normal mood and affect. Her behavior is normal. Judgment and thought content normal.  Nursing note and vitals reviewed.     Patient has been counseled extensively about nutrition and exercise as well as the importance of adherence with medications and regular follow-up. The patient was given clear instructions to go to ER or return to medical center if symptoms don't improve, worsen or new problems develop. The patient verbalized understanding.   Follow-up: Return in about 6 months (around 03/10/2018), or lexapro.   Gildardo Pounds, FNP-BC [facility] and [facility] [place], [place]    09/11/2017, 8:49 PM

## 2017-09-11 ENCOUNTER — Encounter: Payer: Self-pay | Admitting: Nurse Practitioner

## 2017-09-21 ENCOUNTER — Other Ambulatory Visit: Payer: Self-pay | Admitting: Nurse Practitioner

## 2017-09-21 DIAGNOSIS — R590 Localized enlarged lymph nodes: Secondary | ICD-10-CM

## 2017-09-23 ENCOUNTER — Ambulatory Visit: Payer: Self-pay

## 2017-09-28 ENCOUNTER — Ambulatory Visit: Payer: Self-pay | Attending: Nurse Practitioner

## 2017-10-07 ENCOUNTER — Ambulatory Visit: Payer: Self-pay | Attending: Family Medicine

## 2017-10-10 ENCOUNTER — Telehealth: Payer: Self-pay

## 2017-10-10 NOTE — Telephone Encounter (Signed)
Referral for assistance with disability faxed to Abelino Derrick /[employer] [place]. Fax #  - [phone]

## 2017-10-11 ENCOUNTER — Other Ambulatory Visit: Payer: Self-pay | Admitting: Nurse Practitioner

## 2017-10-11 DIAGNOSIS — E782 Mixed hyperlipidemia: Secondary | ICD-10-CM

## 2017-10-11 DIAGNOSIS — I10 Essential (primary) hypertension: Secondary | ICD-10-CM

## 2017-10-11 NOTE — Telephone Encounter (Signed)
Patient has upcoming visit 10/26/17. Will send enough quantity to last until that encounter.

## 2017-10-19 ENCOUNTER — Encounter: Payer: Self-pay | Admitting: Obstetrics & Gynecology

## 2017-10-26 ENCOUNTER — Ambulatory Visit: Payer: Self-pay | Attending: Nurse Practitioner | Admitting: Nurse Practitioner

## 2017-10-26 ENCOUNTER — Encounter: Payer: Self-pay | Admitting: Nurse Practitioner

## 2017-10-26 VITALS — BP 108/75 | Temp 99.2°F | Ht 61.0 in | Wt 151.0 lb

## 2017-10-26 DIAGNOSIS — S91311A Laceration without foreign body, right foot, initial encounter: Secondary | ICD-10-CM | POA: Insufficient documentation

## 2017-10-26 DIAGNOSIS — I10 Essential (primary) hypertension: Secondary | ICD-10-CM | POA: Insufficient documentation

## 2017-10-26 DIAGNOSIS — N393 Stress incontinence (female) (male): Secondary | ICD-10-CM | POA: Insufficient documentation

## 2017-10-26 DIAGNOSIS — S91311D Laceration without foreign body, right foot, subsequent encounter: Secondary | ICD-10-CM

## 2017-10-26 DIAGNOSIS — R0789 Other chest pain: Secondary | ICD-10-CM | POA: Insufficient documentation

## 2017-10-26 DIAGNOSIS — Z79899 Other long term (current) drug therapy: Secondary | ICD-10-CM | POA: Insufficient documentation

## 2017-10-26 DIAGNOSIS — K219 Gastro-esophageal reflux disease without esophagitis: Secondary | ICD-10-CM | POA: Insufficient documentation

## 2017-10-26 DIAGNOSIS — Z760 Encounter for issue of repeat prescription: Secondary | ICD-10-CM | POA: Insufficient documentation

## 2017-10-26 DIAGNOSIS — M797 Fibromyalgia: Secondary | ICD-10-CM | POA: Insufficient documentation

## 2017-10-26 DIAGNOSIS — L511 Stevens-Johnson syndrome: Secondary | ICD-10-CM | POA: Insufficient documentation

## 2017-10-26 DIAGNOSIS — Z882 Allergy status to sulfonamides status: Secondary | ICD-10-CM | POA: Insufficient documentation

## 2017-10-26 DIAGNOSIS — X58XXXA Exposure to other specified factors, initial encounter: Secondary | ICD-10-CM | POA: Insufficient documentation

## 2017-10-26 DIAGNOSIS — F329 Major depressive disorder, single episode, unspecified: Secondary | ICD-10-CM | POA: Insufficient documentation

## 2017-10-26 DIAGNOSIS — J45909 Unspecified asthma, uncomplicated: Secondary | ICD-10-CM | POA: Insufficient documentation

## 2017-10-26 DIAGNOSIS — E119 Type 2 diabetes mellitus without complications: Secondary | ICD-10-CM | POA: Insufficient documentation

## 2017-10-26 DIAGNOSIS — M25511 Pain in right shoulder: Secondary | ICD-10-CM | POA: Insufficient documentation

## 2017-10-26 DIAGNOSIS — E079 Disorder of thyroid, unspecified: Secondary | ICD-10-CM | POA: Insufficient documentation

## 2017-10-26 DIAGNOSIS — Z7984 Long term (current) use of oral hypoglycemic drugs: Secondary | ICD-10-CM | POA: Insufficient documentation

## 2017-10-26 DIAGNOSIS — E785 Hyperlipidemia, unspecified: Secondary | ICD-10-CM | POA: Insufficient documentation

## 2017-10-26 MED ORDER — NEBIVOLOL HCL 5 MG PO TABS: 5.0000 mg | ORAL_TABLET | Freq: Every day | ORAL | 1 refills | Status: DC

## 2017-10-26 MED ORDER — MUPIROCIN 2 % EX OINT: TOPICAL_OINTMENT | CUTANEOUS | 0 refills | Status: DC

## 2017-10-26 NOTE — Patient Instructions (Signed)
Shoulder Pain Many things can cause shoulder pain, including:  An injury.  Moving the arm in the same way again and again (overuse).  Joint pain (arthritis).  Follow these instructions at home: Take these actions to help with your pain:  Squeeze a soft ball or a foam pad as much as you can. This helps to prevent swelling. It also makes the arm stronger.  Take over-the-counter and prescription medicines only as told by your doctor.  If told, put ice on the area: ? Put ice in a plastic bag. ? Place a towel between your skin and the bag. ? Leave the ice on for 20 minutes, 2-3 times per day. Stop putting on ice if it does not help with the pain.  If you were given a shoulder sling or immobilizer: ? Wear it as told. ? Remove it to shower or bathe. ? Move your arm as little as possible. ? Keep your hand moving. This helps prevent swelling.  Contact a doctor if:  Your pain gets worse.  Medicine does not help your pain.  You have new pain in your arm, hand, or fingers. Get help right away if:  Your arm, hand, or fingers: ? Tingle. ? Are numb. ? Are swollen. ? Are painful. ? Turn white or blue. This information is not intended to replace advice given to you by your health care provider. Make sure you discuss any questions you have with your health care provider. Document Released: 08/04/2007 Document Revised: 10/12/2015 Document Reviewed: 06/10/2014 Elsevier Interactive Patient Education  2018 Elsevier Inc.  

## 2017-10-26 NOTE — Progress Notes (Signed)
Assessment & Plan:  [name] was seen today for medication refill.  Diagnoses and all orders for this visit:  Essential hypertension -     nebivolol (BYSTOLIC) 5 MG tablet; Take 1 tablet (5 mg total) by mouth daily. Continue all antihypertensives as prescribed.  Remember to bring in your blood pressure log with you for your follow up appointment.  DASH/Mediterranean Diets are healthier choices for HTN.   Acute pain of right shoulder -     DG Shoulder Right; Future May alternate with heat and ice application for pain relief. May also alternate with acetaminophen and Ibuprofen as prescribed pain relief. Other alternatives include massage, acupuncture and water aerobics.  You must stay active and avoid a sedentary lifestyle.  Laceration of right heel, subsequent encounter -     mupirocin ointment (BACTROBAN) 2 %; Apply to affected area 2 (two) times per day for 7-10 days.    Patient has been counseled on age-appropriate routine health concerns for screening and prevention. These are reviewed and up-to-date. Referrals have been placed accordingly. Immunizations are up-to-date or declined.    Subjective:   Chief Complaint  Patient presents with  . Medication Refill    Pt. stated she need her Bystolic refill per pharmacist.    HPI Rose Robertson 57 y.o. female presents to office today requesting refill of Bystolic.  She has a history of fibromyalgia, depression, hypertension, irregular heart rate and has been taking Bystolic 5 mg daily.  She has a chronic history of left-sided chest wall pain and has been evaluated by cardiology in the past.  It was noted that chest pain was likely occurring from previous surgery and scar tissue from her prior breast implant removal or possibly related to her fibromyalgia.   Essential Hypertension Blood pressure is well controlled.  She is compliant taking Bystolic 10 mg daily.  She currently denies any chest pain, shortness of breath,  dizziness, headaches, bilateral lower extremity edema. BP Readings from Last 3 Encounters:  10/26/17 108/75  09/07/17 123/79  08/15/17 136/89     Shoulder Pain Patient complaints of right shoulder pain. This is evaluated as a personal injury. The pain is described as aching, sharp, stabbing and throbbing.  The onset of the pain was one month ago.  The pain occurs continuously and lasts as long as she is moving her right arm.  Location is global. No history of dislocation. Symptoms are aggravated by all activities. Symptoms are diminished by  rest. Limited activities include: all activities. No stiffness, no weakness, no swelling, crepitus right AC region noted is reported. She has been taking ibuprofen alternating with heat and ice application with no improvement of symptoms.    Right Foot Pain She cut the bottom of her right heel a few weeks ago. She did not seek medical attention and reports she has been applying OTC neosporin to her heel. She denies any signs or symptoms of infections such as fever, warmth, erythema or drainage.  Review of Systems  Constitutional: Negative for fever, malaise/fatigue and weight loss.  HENT: Negative.  Negative for nosebleeds.   Eyes: Negative.  Negative for blurred vision, double vision and photophobia.  Respiratory: Negative.  Negative for cough and shortness of breath.   Cardiovascular: Negative.  Negative for chest pain, palpitations and leg swelling.  Gastrointestinal: Negative.  Negative for heartburn, nausea and vomiting.  Musculoskeletal: Positive for joint pain. Negative for myalgias.       SEE HPI  Skin:  SEE HPI  Neurological: Negative.  Negative for dizziness, focal weakness, seizures and headaches.  Psychiatric/Behavioral: Negative.  Negative for suicidal ideas.    Past Medical History:  Diagnosis Date  . Anal fissure   . Asthma Dx 1991   no inhaler  . C. difficile diarrhea   . Depression    At age of 74  . Diabetes mellitus  without complication ([place]) Dx 6546  . Dysrhythmia    tachycardia -controlled by med  . Fibromyalgia Dx 2010  . GERD (gastroesophageal reflux disease) Dx 2008  . Hyperlipidemia Dx 2010  . Hypertension Dx 2010  . Infection of breast implant ([place]) [id number]   staph and fungemia. breat removed in 2012 in [place]   . Pancreatitis   . Pneumonia   . Spinal headache 1990   BP dropped after spinal anes.  . 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 Tailbone injury 1990   broken   . Thyroid disease Dx 2010  . Urinary incontinence, stress     Past Surgical History:  Procedure Laterality Date  . ABDOMINOPLASTY  1997  . ANAL FISSURE REPAIR  1985  . APPENDECTOMY  1987  . BREAST BIOPSY  2011  . BREAST ENHANCEMENT SURGERY  1997  . BREAST IMPLANT REMOVAL  6/12   implants and benign breast lumps removed  . CHOLECYSTECTOMY  1991  . LAPAROSCOPIC ASSISTED VAGINAL HYSTERECTOMY  12/20/2011   Bilateral Salpingo-Oophorectomy  . TONSILLECTOMY  1978    Family History  Problem Relation Age of Onset  . Coronary artery disease Mother 71  . Hypertension Mother   . Stroke Mother   . Hypertension Father   . Diabetes Father        type 2  . Dementia Father   . Stroke Father        deceased, passed away had over 100 mini strokes  . Ovarian cancer Maternal Aunt 80  . Colon polyps Sister   . Diabetes Paternal Uncle   . Diabetes Paternal Grandmother     Social History Reviewed with no changes to be made today.   Outpatient Medications Prior to Visit  Medication Sig Dispense Refill  . atorvastatin (LIPITOR) 20 MG tablet TAKE 1 TABLET BY MOUTH DAILY. 14 tablet 0  . cholecalciferol (VITAMIN D) 1000 UNITS tablet Take 5,000 Units by mouth daily.     . Cyanocobalamin (VITAMIN B-12 PO) Take by mouth.    . escitalopram (LEXAPRO) 20 MG tablet Take 1 tablet (20 mg total) by mouth daily. 90 tablet 1  . fish oil-omega-3 fatty acids 1000 MG capsule Take 1,200 mg by  mouth daily.    . milk thistle 175 MG tablet Take 175 mg by mouth daily.    Marland Kitchen NALTREXONE HCL PO Take 4.5 mg by mouth at bedtime. Uses Naltrexone 4.5mg  #3    . OREGANO PO Take 460 mg by mouth 2 (two) times daily.    Marland Kitchen BYSTOLIC 5 MG tablet TAKE 1 TABLET BY MOUTH DAILY. 14 tablet 0  . gabapentin (NEURONTIN) 300 MG capsule Take 1 capsule (300 mg total) by mouth 3 (three) times daily. (Patient not taking: Reported on 09/07/2017) 90 capsule 3  . metFORMIN (GLUCOPHAGE) 500 MG tablet TAKE 1 TABLET BY MOUTH DAILY WITH BREAKFAST. (Patient not taking: Reported on 08/15/2017) 90 tablet 0  . pantoprazole (PROTONIX) 40 MG tablet TAKE 1 TABLET BY MOUTH DAILY. 30 tablet 2  . phenazopyridine (PYRIDIUM) 95 MG tablet Take 1 tablet (  95 mg total) by mouth 3 (three) times daily as needed for pain. (Patient not taking: Reported on 10/26/2017) 10 tablet 0   No facility-administered medications prior to visit.     Allergies  Allergen Reactions  . Sulfa Antibiotics     Remo Lipps Johnson's syndrome  . Adhesive [Tape] Other (See Comments)    Blisters   . Latex     rash  . Pineapple Flavor        Objective:    BP 108/75 (BP Location: Left Arm, Patient Position: Sitting, Cuff Size: Normal)   Temp 99.2 F (37.3 C) (Oral)   Ht 5\' 1"  (1.549 m)   Wt 151 lb (68.5 kg)   LMP 12/05/2011   BMI 28.53 kg/m  Wt Readings from Last 3 Encounters:  10/26/17 151 lb (68.5 kg)  09/07/17 150 lb 9.6 oz (68.3 kg)  08/15/17 150 lb (68 kg)    Physical Exam  Constitutional: She is oriented to person, place, and time. She appears well-developed and well-nourished. She is cooperative.  HENT:  Head: Normocephalic and atraumatic.  Eyes: EOM are normal.  Neck: Normal range of motion.  Cardiovascular: Normal rate, regular rhythm, normal heart sounds and intact distal pulses. Exam reveals no gallop and no friction rub.  No murmur heard. Pulmonary/Chest: Effort normal and breath sounds normal. No tachypnea. No respiratory distress.  She has no decreased breath sounds. She has no wheezes. She has no rhonchi. She has no rales. She exhibits no tenderness.  Abdominal: Soft. Bowel sounds are normal.  Musculoskeletal: She exhibits no edema.       Right shoulder: She exhibits decreased range of motion and pain. She exhibits no tenderness and no swelling.       Feet:  Neurological: She is alert and oriented to person, place, and time. Coordination normal.  Skin: Skin is warm and dry. No rash noted. No erythema. No pallor.  Psychiatric: She has a normal mood and affect. Her behavior is normal. Judgment and thought content normal.  Nursing note and vitals reviewed.        Patient has been counseled extensively about nutrition and exercise as well as the importance of adherence with medications and regular follow-up. The patient was given clear instructions to go to ER or return to medical center if symptoms don't improve, worsen or new problems develop. The patient verbalized understanding.   Follow-up: Return in about 4 months (around 02/25/2018).   Gildardo Pounds, FNP-BC [facility] and [facility], [place]   11/01/2017, 8:56 PM

## 2017-10-28 ENCOUNTER — Ambulatory Visit (HOSPITAL_COMMUNITY)
Admission: RE | Admit: 2017-10-28 | Discharge: 2017-10-28 | Disposition: A | Discharge status: Home / Self Care | Payer: Self-pay | Source: Ambulatory Visit | Attending: Nurse Practitioner | Admitting: Nurse Practitioner

## 2017-10-28 ENCOUNTER — Other Ambulatory Visit: Payer: Self-pay | Admitting: Nurse Practitioner

## 2017-10-28 ENCOUNTER — Telehealth: Payer: Self-pay

## 2017-10-28 DIAGNOSIS — M25511 Pain in right shoulder: Secondary | ICD-10-CM | POA: Insufficient documentation

## 2017-10-28 MED ORDER — TIZANIDINE HCL 4 MG PO CAPS: 4.0000 mg | ORAL_CAPSULE | Freq: Three times a day (TID) | ORAL | 0 refills | Status: DC

## 2017-10-28 NOTE — Telephone Encounter (Signed)
Patient came in the clinic and stated that PCP was going to prescribe her muscle relaxer for her right shoulder.  Patient use the [facility] pharmacy.

## 2017-11-01 ENCOUNTER — Telehealth: Payer: Self-pay | Admitting: Nurse Practitioner

## 2017-11-01 ENCOUNTER — Encounter: Payer: Self-pay | Admitting: Nurse Practitioner

## 2017-11-02 ENCOUNTER — Other Ambulatory Visit: Payer: Self-pay | Admitting: Nurse Practitioner

## 2017-11-02 DIAGNOSIS — M25511 Pain in right shoulder: Principal | ICD-10-CM

## 2017-11-02 DIAGNOSIS — G8929 Other chronic pain: Secondary | ICD-10-CM

## 2017-11-02 NOTE — Telephone Encounter (Signed)
-----   Message from Gildardo Pounds, NP sent at 11/01/2017  9:32 PM EDT ----- Shoulder xray is negative for any bone abnormalities. Pain is likely musculoskeletal in nature.

## 2017-11-02 NOTE — Telephone Encounter (Signed)
[name] spoke to patient to inform on Xray results.  Pt. Verified DOB. Pt. Understood.  Pt. Asked what is the next step she can try per patient she can barely lift up her arm.

## 2017-11-02 NOTE — Telephone Encounter (Signed)
I will order an MRI

## 2017-11-03 NOTE — Telephone Encounter (Signed)
[name] attempt to call patient to inform her PCP ordered an MRI.  No answer and left a VM for patient to call back.  If patient call back ,please ask what day and time she would like to schedule her MRI.

## 2017-11-12 ENCOUNTER — Ambulatory Visit (HOSPITAL_COMMUNITY)
Admission: RE | Admit: 2017-11-12 | Discharge: 2017-11-12 | Disposition: A | Discharge status: Home / Self Care | Payer: Self-pay | Source: Ambulatory Visit | Attending: Nurse Practitioner | Admitting: Nurse Practitioner

## 2017-11-12 DIAGNOSIS — M25511 Pain in right shoulder: Secondary | ICD-10-CM | POA: Insufficient documentation

## 2017-11-12 DIAGNOSIS — M75121 Complete rotator cuff tear or rupture of right shoulder, not specified as traumatic: Secondary | ICD-10-CM | POA: Insufficient documentation

## 2017-11-12 DIAGNOSIS — G8929 Other chronic pain: Secondary | ICD-10-CM | POA: Insufficient documentation

## 2017-11-12 DIAGNOSIS — M19011 Primary osteoarthritis, right shoulder: Secondary | ICD-10-CM | POA: Insufficient documentation

## 2017-11-13 ENCOUNTER — Telehealth: Payer: Self-pay | Admitting: Nurse Practitioner

## 2017-11-13 DIAGNOSIS — S46001A Unspecified injury of muscle(s) and tendon(s) of the rotator cuff of right shoulder, initial encounter: Secondary | ICD-10-CM

## 2017-11-13 NOTE — Telephone Encounter (Signed)
na

## 2017-11-14 ENCOUNTER — Telehealth: Payer: Self-pay

## 2017-11-14 NOTE — Telephone Encounter (Signed)
[name] spoke to patient to inform on MRI results.  Patient verified DOB. Patient understood.   Patient is aware referral has been made.

## 2017-11-14 NOTE — Telephone Encounter (Signed)
-----   Message from Gildardo Pounds, NP sent at 11/13/2017 11:05 PM EDT ----- MRI shows tear in rotator cuff. Will refer you to orthopedics.

## 2017-11-22 ENCOUNTER — Ambulatory Visit (INDEPENDENT_AMBULATORY_CARE_PROVIDER_SITE_OTHER): Payer: Self-pay | Admitting: Orthopaedic Surgery

## 2017-11-22 ENCOUNTER — Encounter (INDEPENDENT_AMBULATORY_CARE_PROVIDER_SITE_OTHER): Payer: Self-pay | Admitting: Orthopaedic Surgery

## 2017-11-22 DIAGNOSIS — M7541 Impingement syndrome of right shoulder: Secondary | ICD-10-CM

## 2017-11-22 DIAGNOSIS — S46011A Strain of muscle(s) and tendon(s) of the rotator cuff of right shoulder, initial encounter: Secondary | ICD-10-CM

## 2017-11-22 DIAGNOSIS — M75121 Complete rotator cuff tear or rupture of right shoulder, not specified as traumatic: Secondary | ICD-10-CM

## 2017-11-22 HISTORY — DX: Complete rotator cuff tear or rupture of right shoulder, not specified as traumatic: M75.121

## 2017-11-22 NOTE — Progress Notes (Signed)
Office Visit Note   Patient: Rose Robertson           Date of Birth: [birth year]           MRN: [id number] Visit Date: 11/22/2017              Requested by: Gildardo Pounds, NP [place], [place] [address] PCP: Gildardo Pounds, NP   Assessment & Plan: Visit Diagnoses:  1. Impingement syndrome of right shoulder   2. Traumatic complete tear of right rotator cuff, initial encounter     Plan: MRI findings are consistent with a full-thickness tear of the supraspinatus near the junction of the supraspinatus and infraspinatus.  This is not retracted.  She does have moderate AC joint arthropathy.  Thanks patient has considerable pain and dysfunction as a result of this rotator cuff tear.  At this point patient elects to proceed with arthroscopic rotator cuff repair.  Details of the surgery along with risks and benefits and postoperative rehab and recovery reviewed.  Patient will call the near future to schedule surgery for around November.    Follow-Up Instructions: Return if symptoms worsen or fail to improve.   Orders:  No orders of the defined types were placed in this encounter.  No orders of the defined types were placed in this encounter.     Procedures: No procedures performed   Clinical Data: No additional findings.   Subjective: Chief Complaint  Patient presents with  . Right Shoulder - Pain    Rose Robertson is a very pleasant 57 year old right-hand-dominant female who has had right shoulder pain since mid July.  She was on a tubing trip and she had to paddle really hard to get out of the current as a result she had acute onset of right shoulder pain.  This is failed to improve with conservative treatment.  She is now having constant pain with ADLs and she is not able to work.  Denies any neck pain or radicular symptoms.  Denies any numbness and tingling.  Pain is worse with elevation of the arm and with lifting.   Review of Systems    Constitutional: Negative.   HENT: Negative.   Eyes: Negative.   Respiratory: Negative.   Cardiovascular: Negative.   Endocrine: Negative.   Musculoskeletal: Negative.   Neurological: Negative.   Hematological: Negative.   Psychiatric/Behavioral: Negative.   All other systems reviewed and are negative.    Objective: Vital Signs: LMP 12/05/2011   Physical Exam  Constitutional: She is oriented to person, place, and time. She appears well-developed and well-nourished.  HENT:  Head: Normocephalic and atraumatic.  Eyes: EOM are normal.  Neck: Neck supple.  Pulmonary/Chest: Effort normal.  Abdominal: Soft.  Neurological: She is alert and oriented to person, place, and time.  Skin: Skin is warm. Capillary refill takes less than 2 seconds.  Psychiatric: She has a normal mood and affect. Her behavior is normal. Judgment and thought content normal.  Nursing note and vitals reviewed.   Ortho Exam Shoulder exam shows positive drop arm in pain with empty can testing.  Positive impingement sign.  Mildly positive cross adduction.  Negative Speed negative crank Specialty Comments:  No specialty comments available.  Imaging: No results found.   PMFS History: Patient Active Problem List   Diagnosis Date Noted  . Complete tear of right rotator cuff 11/22/2017  . S/P complete hysterectomy 05/30/2014  . Healthcare maintenance 05/30/2014  . Osteoarthritis of both hands 05/30/2014  .  Left-sided chest wall pain 05/29/2014  . Obesity (BMI 30.0-34.9) 05/22/2014  . Chronic left hip pain 05/22/2014  . Abdominal wall pain in left flank 05/21/2014  . FH: colon polyps 12/30/2010  . GERD (gastroesophageal reflux disease) 11/06/2010  . Fibromyalgia syndrome 11/06/2010  . 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 BREAST IMPLANTS, BILATERAL, HX OF 10/01/2009  . Right maxillary sinusitis, chronic 08/27/2009  . HEADACHES, HX OF 06/30/2009  . DEPRESSION 06/03/2009  . ASTHMA 06/03/2009  .  URINARY INCONTINENCE 06/03/2009   Past Medical History:  Diagnosis Date  . Anal fissure   . Asthma Dx 1991   no inhaler  . C. difficile diarrhea   . Depression    At age of 24  . Diabetes mellitus without complication ([place]) Dx 0071  . Dysrhythmia    tachycardia -controlled by med  . Fibromyalgia Dx 2010  . GERD (gastroesophageal reflux disease) Dx 2008  . Hyperlipidemia Dx 2010  . Hypertension Dx 2010  . Infection of breast implant ([place]) [id number]   staph and fungemia. breat removed in 2012 in [place]   . Pancreatitis   . Pneumonia   . Spinal headache 1990   BP dropped after spinal anes.  . 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 Tailbone injury 1990   broken   . Thyroid disease Dx 2010  . Urinary incontinence, stress     Family History  Problem Relation Age of Onset  . Coronary artery disease Mother 71  . Hypertension Mother   . Stroke Mother   . Hypertension Father   . Diabetes Father        type 2  . Dementia Father   . Stroke Father        deceased, passed away had over 100 mini strokes  . Ovarian cancer Maternal Aunt 80  . Colon polyps Sister   . Diabetes Paternal Uncle   . Diabetes Paternal Grandmother     Past Surgical History:  Procedure Laterality Date  . ABDOMINOPLASTY  1997  . ANAL FISSURE REPAIR  1985  . APPENDECTOMY  1987  . BREAST BIOPSY  2011  . BREAST ENHANCEMENT SURGERY  1997  . BREAST IMPLANT REMOVAL  6/12   implants and benign breast lumps removed  . CHOLECYSTECTOMY  1991  . LAPAROSCOPIC ASSISTED VAGINAL HYSTERECTOMY  12/20/2011   Bilateral Salpingo-Oophorectomy  . TONSILLECTOMY  1978   Social History   Occupational History  . Occupation: unemployed  Tobacco Use  . Smoking status: Current Every Day Smoker    Packs/day: 0.10    Years: 34.00    Pack years: 3.40    Types: Cigarettes  . Smokeless tobacco: Never Used  Substance and Sexual Activity  . Alcohol use: Yes    Comment:  occasionally  . Drug use: No  . Sexual activity: Yes    Birth control/protection: None    Comment: ? post menopausal

## 2017-11-23 ENCOUNTER — Telehealth: Payer: Self-pay

## 2017-11-23 NOTE — Telephone Encounter (Signed)
As per Abelino Derrick, [employer], they have left patient messages with information about applying for disability  And have sent a 10 day letter to her. The case is now closed.

## 2017-11-30 ENCOUNTER — Encounter: Payer: Self-pay | Admitting: Obstetrics & Gynecology

## 2017-12-22 ENCOUNTER — Other Ambulatory Visit: Payer: Self-pay | Admitting: Family Medicine

## 2017-12-22 ENCOUNTER — Other Ambulatory Visit: Payer: Self-pay | Admitting: Nurse Practitioner

## 2017-12-22 DIAGNOSIS — K219 Gastro-esophageal reflux disease without esophagitis: Secondary | ICD-10-CM

## 2017-12-22 DIAGNOSIS — E782 Mixed hyperlipidemia: Secondary | ICD-10-CM

## 2018-01-05 ENCOUNTER — Ambulatory Visit: Payer: Self-pay

## 2018-01-18 ENCOUNTER — Other Ambulatory Visit: Payer: Self-pay | Admitting: Pharmacist

## 2018-01-18 DIAGNOSIS — K219 Gastro-esophageal reflux disease without esophagitis: Secondary | ICD-10-CM

## 2018-01-18 MED ORDER — TIZANIDINE HCL 4 MG PO CAPS: 4.0000 mg | ORAL_CAPSULE | Freq: Three times a day (TID) | ORAL | 0 refills | Status: DC

## 2018-01-18 MED ORDER — PANTOPRAZOLE SODIUM 40 MG PO TBEC: 40.0000 mg | DELAYED_RELEASE_TABLET | Freq: Every day | ORAL | 0 refills | Status: DC

## 2018-02-10 ENCOUNTER — Other Ambulatory Visit: Payer: Self-pay | Admitting: Nurse Practitioner

## 2018-02-10 DIAGNOSIS — M5440 Lumbago with sciatica, unspecified side: Principal | ICD-10-CM

## 2018-02-10 DIAGNOSIS — G8929 Other chronic pain: Secondary | ICD-10-CM

## 2018-02-17 ENCOUNTER — Other Ambulatory Visit: Payer: Self-pay

## 2018-02-17 ENCOUNTER — Encounter: Payer: Self-pay | Admitting: Nurse Practitioner

## 2018-02-17 ENCOUNTER — Ambulatory Visit: Payer: Self-pay | Attending: Nurse Practitioner | Admitting: Nurse Practitioner

## 2018-02-17 VITALS — BP 117/80 | HR 60 | Temp 98.8°F | Ht 61.0 in | Wt 158.2 lb

## 2018-02-17 DIAGNOSIS — M797 Fibromyalgia: Secondary | ICD-10-CM | POA: Insufficient documentation

## 2018-02-17 DIAGNOSIS — Z79899 Other long term (current) drug therapy: Secondary | ICD-10-CM | POA: Insufficient documentation

## 2018-02-17 DIAGNOSIS — R000 Tachycardia, unspecified: Secondary | ICD-10-CM | POA: Insufficient documentation

## 2018-02-17 DIAGNOSIS — E119 Type 2 diabetes mellitus without complications: Secondary | ICD-10-CM | POA: Insufficient documentation

## 2018-02-17 DIAGNOSIS — K219 Gastro-esophageal reflux disease without esophagitis: Secondary | ICD-10-CM | POA: Insufficient documentation

## 2018-02-17 DIAGNOSIS — F329 Major depressive disorder, single episode, unspecified: Secondary | ICD-10-CM | POA: Insufficient documentation

## 2018-02-17 DIAGNOSIS — E785 Hyperlipidemia, unspecified: Secondary | ICD-10-CM | POA: Insufficient documentation

## 2018-02-17 DIAGNOSIS — Z833 Family history of diabetes mellitus: Secondary | ICD-10-CM | POA: Insufficient documentation

## 2018-02-17 DIAGNOSIS — I10 Essential (primary) hypertension: Secondary | ICD-10-CM | POA: Insufficient documentation

## 2018-02-17 DIAGNOSIS — F32A Depression, unspecified: Secondary | ICD-10-CM

## 2018-02-17 DIAGNOSIS — J45909 Unspecified asthma, uncomplicated: Secondary | ICD-10-CM | POA: Insufficient documentation

## 2018-02-17 DIAGNOSIS — F17210 Nicotine dependence, cigarettes, uncomplicated: Secondary | ICD-10-CM | POA: Insufficient documentation

## 2018-02-17 DIAGNOSIS — G8929 Other chronic pain: Secondary | ICD-10-CM | POA: Insufficient documentation

## 2018-02-17 DIAGNOSIS — M25511 Pain in right shoulder: Secondary | ICD-10-CM | POA: Insufficient documentation

## 2018-02-17 DIAGNOSIS — Z7984 Long term (current) use of oral hypoglycemic drugs: Secondary | ICD-10-CM | POA: Insufficient documentation

## 2018-02-17 DIAGNOSIS — A0472 Enterocolitis due to Clostridium difficile, not specified as recurrent: Secondary | ICD-10-CM | POA: Insufficient documentation

## 2018-02-17 DIAGNOSIS — E079 Disorder of thyroid, unspecified: Secondary | ICD-10-CM | POA: Insufficient documentation

## 2018-02-17 DIAGNOSIS — Z8249 Family history of ischemic heart disease and other diseases of the circulatory system: Secondary | ICD-10-CM | POA: Insufficient documentation

## 2018-02-17 DIAGNOSIS — F419 Anxiety disorder, unspecified: Secondary | ICD-10-CM | POA: Insufficient documentation

## 2018-02-17 MED ORDER — NEBIVOLOL HCL 5 MG PO TABS: 5.0000 mg | ORAL_TABLET | Freq: Every day | ORAL | 1 refills | Status: DC

## 2018-02-17 MED ORDER — TIZANIDINE HCL 4 MG PO CAPS: 4.0000 mg | ORAL_CAPSULE | Freq: Three times a day (TID) | ORAL | 3 refills | Status: DC

## 2018-02-17 MED ORDER — SERTRALINE HCL 50 MG PO TABS: 50.0000 mg | ORAL_TABLET | Freq: Every day | ORAL | 3 refills | Status: DC

## 2018-02-17 MED ORDER — PANTOPRAZOLE SODIUM 40 MG PO TBEC: 40.0000 mg | DELAYED_RELEASE_TABLET | Freq: Every day | ORAL | 1 refills | Status: DC

## 2018-02-17 NOTE — Progress Notes (Signed)
Assessment & Plan:  Rose Robertson was seen today for follow-up.  Diagnoses and all orders for this visit:  Anxiety and depression -     sertraline (ZOLOFT) 50 MG tablet; Take 1 tablet (50 mg total) by mouth daily. Take medication as prescribed.   Essential hypertension -     nebivolol (BYSTOLIC) 5 MG tablet; Take 1 tablet (5 mg total) by mouth daily. -     Ambulatory referral to Cardiology Continue all antihypertensives as prescribed.  Remember to bring in your blood pressure log with you for your follow up appointment.  DASH/Mediterranean Diets are healthier choices for HTN.    Gastroesophageal reflux disease, esophagitis presence not specified -     pantoprazole (PROTONIX) 40 MG tablet; Take 1 tablet (40 mg total) by mouth daily. INSTRUCTIONS: Avoid GERD Triggers: acidic, spicy or fried foods, caffeine, coffee, sodas,  alcohol and chocolate.   Chronic right shoulder pain -     tiZANidine (ZANAFLEX) 4 MG capsule; Take 1 capsule (4 mg total) by mouth 3 (three) times daily.  May alternate with heat and ice application for pain relief. May also alternate with acetaminophen and Ibuprofen as prescribed pain relief. Other alternatives include massage, acupuncture and water aerobics.  You must stay active and avoid a sedentary lifestyle.   Patient has been counseled on age-appropriate routine health concerns for screening and prevention. These are reviewed and up-to-date. Referrals have been placed accordingly. Immunizations are up-to-date or declined.    Subjective:   Chief Complaint  Patient presents with  . Follow-up    medication change   HPI Rose Robertson 57 y.o. female presents to office today for follow up to anxiety and depression. She would like referral to cardiology. Reports her mother had a heart attack in her 53s and she has a significant cardiac family history. She has history of hyperlipidemia and has her blood work taken when she travels to [place].   She continues to smoke about 4-6 cigarettes a day.     Anxiety and Depression She is requesting to change her lexapro. Endorsee worsening joint pain with taking lexapro. Will try her on zoloft. She will need to wean off lexapro as she has been taking it for a few months. PHQ9 score has also worsened since her last office visit.  She denies any suicidal ideation.  Depression screen [facility] 2/9 02/17/2018 08/15/2017 03/28/2017 05/30/2014 05/29/2014  Decreased Interest 2 1 3 3 3   Down, Depressed, Hopeless 3 3 3 3 3   PHQ - 2 Score 5 4 6 6 6   Altered sleeping 3 3 3 3 3   Tired, decreased energy 2 1 3 3 3   Change in appetite 1 - 2 1 1   Feeling bad or failure about yourself  3 1 3 2 2   Trouble concentrating 2 2 3 2 2   Moving slowly or fidgety/restless 2 1 1 1 1   Suicidal thoughts 0 0 0 3 3  PHQ-9 Score 18 12 21 21 21     Essential Hypertension Blood pressure is well controlled today. She endorses medication compliance taking bystolic 5 mg daily. She currently denies chest pain, shortness of breath, palpitations, lightheadedness, dizziness, headaches or BLE edema.   BP Readings from Last 3 Encounters:  02/17/18 117/80  10/26/17 108/75  09/07/17 123/79   Chronic Right Shoulder Pain Well controlled with zanaflex. Pain is located in the global shoulder and described as aching, sharp, stabbing and throbbing. Aggravating symptoms: heavy lifting and lateral raise of right arm. She denies  any injury or trauma to the shoulder.    Review of Systems  Constitutional: Negative for fever, malaise/fatigue and weight loss.  HENT: Positive for tinnitus (Chronic. Evaluated by an audiologist. It was recommended that she had have hearing aids.  ). Negative for nosebleeds.   Eyes: Negative.  Negative for blurred vision, double vision and photophobia.  Respiratory: Negative.  Negative for cough and shortness of breath.   Cardiovascular: Negative.  Negative for chest pain, palpitations and leg swelling.    Gastrointestinal: Positive for heartburn. Negative for nausea and vomiting.  Musculoskeletal: Positive for joint pain and myalgias.       SEE HPI  Neurological: Negative.  Negative for dizziness, focal weakness, seizures and headaches.  Psychiatric/Behavioral: Positive for depression. Negative for suicidal ideas. The patient is nervous/anxious.     Past Medical History:  Diagnosis Date  . Anal fissure   . Asthma Dx 1991   no inhaler  . C. difficile diarrhea   . Depression    At age of 79  . Diabetes mellitus without complication ([place]) Dx 0076  . Dysrhythmia    tachycardia -controlled by med  . Fibromyalgia Dx 2010  . GERD (gastroesophageal reflux disease) Dx 2008  . Hyperlipidemia Dx 2010  . Hypertension Dx 2010  . Infection of breast implant ([place]) [id number]   staph and fungemia. breat removed in 2012 in [place]   . Pancreatitis   . Pneumonia   . Spinal headache 1990   BP dropped after spinal anes.  . 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 Tailbone injury 1990   broken   . Thyroid disease Dx 2010  . Urinary incontinence, stress     Past Surgical History:  Procedure Laterality Date  . ABDOMINOPLASTY  1997  . ANAL FISSURE REPAIR  1985  . APPENDECTOMY  1987  . BREAST BIOPSY  2011  . BREAST ENHANCEMENT SURGERY  1997  . BREAST IMPLANT REMOVAL  6/12   implants and benign breast lumps removed  . CHOLECYSTECTOMY  1991  . LAPAROSCOPIC ASSISTED VAGINAL HYSTERECTOMY  12/20/2011   Bilateral Salpingo-Oophorectomy  . TONSILLECTOMY  1978    Family History  Problem Relation Age of Onset  . Coronary artery disease Mother 45  . Hypertension Mother   . Stroke Mother   . Hypertension Father   . Diabetes Father        type 2  . Dementia Father   . Stroke Father        deceased, passed away had over 100 mini strokes  . Ovarian cancer Maternal Aunt 80  . Colon polyps Sister   . Diabetes Paternal Uncle   . Diabetes Paternal  Grandmother     Social History Reviewed with no changes to be made today.   Outpatient Medications Prior to Visit  Medication Sig Dispense Refill  . atorvastatin (LIPITOR) 20 MG tablet TAKE 1 TABLET BY MOUTH DAILY. 30 tablet 2  . cholecalciferol (VITAMIN D) 1000 UNITS tablet Take 5,000 Units by mouth daily.     . Cyanocobalamin (VITAMIN B-12 PO) Take by mouth.    . fish oil-omega-3 fatty acids 1000 MG capsule Take 1,200 mg by mouth daily.    Marland Kitchen gabapentin (NEURONTIN) 300 MG capsule TAKE 1 CAPSULE (300 MG TOTAL) BY MOUTH 3 (THREE) TIMES DAILY. 90 capsule 3  . milk thistle 175 MG tablet Take 175 mg by mouth daily.    . mupirocin ointment (BACTROBAN) 2 % Apply to  affected area 2 (two) times per day for 7-10 days. 30 g 0  . OREGANO PO Take 460 mg by mouth 2 (two) times daily.    Marland Kitchen escitalopram (LEXAPRO) 20 MG tablet Take 1 tablet (20 mg total) by mouth daily. 90 tablet 1  . metFORMIN (GLUCOPHAGE) 500 MG tablet TAKE 1 TABLET BY MOUTH DAILY WITH BREAKFAST. (Patient not taking: Reported on 08/15/2017) 90 tablet 0  . NALTREXONE HCL PO Take 4.5 mg by mouth at bedtime. Uses Naltrexone 4.5mg  #3    . nebivolol (BYSTOLIC) 5 MG tablet Take 1 tablet (5 mg total) by mouth daily. 90 tablet 1  . pantoprazole (PROTONIX) 40 MG tablet Take 1 tablet (40 mg total) by mouth daily. 30 tablet 0  . phenazopyridine (PYRIDIUM) 95 MG tablet Take 1 tablet (95 mg total) by mouth 3 (three) times daily as needed for pain. (Patient not taking: Reported on 10/26/2017) 10 tablet 0  . tiZANidine (ZANAFLEX) 4 MG capsule Take 1 capsule (4 mg total) by mouth 3 (three) times daily. 60 capsule 0   No facility-administered medications prior to visit.     Allergies  Allergen Reactions  . Sulfa Antibiotics     Remo Lipps Johnson's syndrome  . Adhesive [Tape] Other (See Comments)    Blisters   . Latex     rash  . Pineapple Flavor        Objective:    BP 117/80 (BP Location: Left Arm, Patient Position: Sitting, Cuff Size: Normal)    Pulse 60   Temp 98.8 F (37.1 C) (Oral)   Ht 5\' 1"  (1.549 m)   Wt 158 lb 3.2 oz (71.8 kg)   LMP 12/05/2011   SpO2 95%   BMI 29.89 kg/m  Wt Readings from Last 3 Encounters:  02/17/18 158 lb 3.2 oz (71.8 kg)  10/26/17 151 lb (68.5 kg)  09/07/17 150 lb 9.6 oz (68.3 kg)    Physical Exam Vitals signs and nursing note reviewed.  Constitutional:      Appearance: She is well-developed.  HENT:     Head: Normocephalic and atraumatic.  Neck:     Musculoskeletal: Normal range of motion.  Cardiovascular:     Rate and Rhythm: Normal rate and regular rhythm.     Heart sounds: Normal heart sounds. No murmur. No friction rub. No gallop.   Pulmonary:     Effort: Pulmonary effort is normal. No tachypnea or respiratory distress.     Breath sounds: Normal breath sounds. No decreased breath sounds, wheezing, rhonchi or rales.  Chest:     Chest wall: No tenderness.  Abdominal:     General: Bowel sounds are normal.     Palpations: Abdomen is soft.  Musculoskeletal: Normal range of motion.     Right shoulder: She exhibits no tenderness, no bony tenderness, no swelling and no effusion.  Skin:    General: Skin is warm and dry.  Neurological:     Mental Status: She is alert and oriented to person, place, and time.     Coordination: Coordination normal.  Psychiatric:        Behavior: Behavior normal. Behavior is cooperative.        Thought Content: Thought content normal.        Judgment: Judgment normal.          Patient has been counseled extensively about nutrition and exercise as well as the importance of adherence with medications and regular follow-up. The patient was given clear instructions to go to ER or  return to medical center if symptoms don't improve, worsen or new problems develop. The patient verbalized understanding.   Follow-up: Return in about 6 weeks (around 03/31/2018) for anxiety and depression.   Gildardo Pounds, FNP-BC [facility] and [place], [place]   02/19/2018, 6:49 PM

## 2018-02-17 NOTE — Patient Instructions (Addendum)
WEANING LEXAPRO  Take half a tablet for one week Take half a tablet every other day for one week Take half a tablet every 3 days for one week Start zoloft on week 4 and discontinue the lexapro.   Behavioral Health Resources:   What if I or someone I know is in crisis?  . If you are thinking about harming yourself or having thoughts of suicide, or if you know someone who is, seek help right away.  . Call your doctor or mental health care provider.  . Call 911 or go to a hospital emergency room to get immediate help, or ask a friend or family member to help you do these things.  . Call the [place] National Suicide Prevention Lifeline's toll-free, 24-hour hotline at 1-800-273-TALK 307-732-7535) or TTY: 1-800-799-4 TTY 831-349-8479) to talk to a trained counselor.  . If you are in crisis, make sure you are not left alone.   . If someone else is in crisis, make sure he or she is not left alone   24 Hour Availability  [facility]  [address], [place], [place] [address]  [phone] or [phone]  [facility] [employer] (Domestic Violence, Rape & Victim Assistance [phone]  [facility]  [address], [place]  [address]               9102158098 or ([phone]  [place]    (ONLY from 8am-4pm)    [phone]  [facility] (24/7)   [phone]  [place] National Suicide Hotline   (779)740-7035 Diamantina Monks)  Support from local police to aid getting patient to hospital (http://www.[place].gov/index.aspx?page=2797)         ONGOING BEHAVIORAL HEALTH SUPPORT FOR UNINSURED and UNDERINSURED:  [facility]  ([phone]   [place] first time, Monday-Friday, 8:30am-5:00pm  *Bring snack, drink, something to do, long wait at first visit, they do have pharmacy for behavioral health medications/ Bring own interpreter at first  visit, if needed  [employer] [facility] [place]  [place] Monday-Friday, 8:30am-12pm & 1-2:30pm  *pacientes que hablen espanol, favor comunicarse con el Sr. [place], extension [address] [place], extension [place]:  [phone] or [email or web address] [email or web address]  [address], [address]  Call or email, may self-refer  * uninsured/underinsured, [phone], have both mental health and substance use challenges   [facility]:  Phone [phone]; Fax [phone]  *Call to schedule an appointment  3rd Floor located @?[address], corner of [place]. and [address]  Mon-Thursday: 8:30am-8:00pm Friday: 8:30am-7:00pm  * Be sure to park in a space labeled "Psychology Department," located to the right of the main door of the building. Enter the main doors facing the parking lot and take the elevator or stairs to the 3rd Floor.   [place]:  [phone] or  [phone] (24/hour helpline)  [address]  Call to make appointment, tends to be a long wait to begin services, depending on insurance    [facility]  [phone] ??  *Call to schedule an appointment   [address] [place], 8:00am-5:00pm            [place]  [phone] [address], [address] [employer], walk-in 8am-3pm  First appointment  is assessment, then will make appointment for psychiatry

## 2018-02-19 ENCOUNTER — Encounter: Payer: Self-pay | Admitting: Nurse Practitioner

## 2018-02-27 ENCOUNTER — Ambulatory Visit: Payer: Self-pay | Admitting: Nurse Practitioner

## 2018-03-16 ENCOUNTER — Telehealth: Payer: Self-pay | Admitting: Nurse Practitioner

## 2018-03-16 NOTE — Telephone Encounter (Signed)
Noted  

## 2018-03-16 NOTE — Telephone Encounter (Addendum)
Pt is taking Zoloft. She is on day 3. She takes medication at night.  She complains of side effects from previous message:  Irregular HB, abdominal pain ( "feels like horse shoe pain"),  Headache, insomnia, and sweatiness.  Worse side effect is abdominal. Denies nausea. Pt advised to try tylenol for pain or heat pad.  Denies chest pain or SOB. Was advised if theses sx's onset to seek immediate attention.    Discussed with Pharmacist if patient can d/c Zoloft without have withdrawals. Advised that patient may d/c medication.  Pt aware of f/u appointment 03/31/2018 with PCP.

## 2018-03-16 NOTE — Telephone Encounter (Signed)
Patient called because she says she has been experiencing side effects from this medication. Patient would like to know what she should do and if someone could follow up with her.   Symptoms patient has expressed: *irregular heartbeat *Stomach ache *Headache *insomnia *sweat   Patient was advised to go to the UC or ED. If needed.

## 2018-03-22 ENCOUNTER — Other Ambulatory Visit: Payer: Self-pay | Admitting: Pharmacist

## 2018-03-22 ENCOUNTER — Other Ambulatory Visit: Payer: Self-pay | Admitting: Family Medicine

## 2018-03-22 DIAGNOSIS — I10 Essential (primary) hypertension: Secondary | ICD-10-CM

## 2018-03-22 DIAGNOSIS — E782 Mixed hyperlipidemia: Secondary | ICD-10-CM

## 2018-03-22 MED ORDER — NEBIVOLOL HCL 5 MG PO TABS: 5.0000 mg | ORAL_TABLET | Freq: Every day | ORAL | 0 refills | Status: DC

## 2018-03-29 ENCOUNTER — Ambulatory Visit: Payer: Self-pay | Admitting: Cardiovascular Disease

## 2018-03-31 ENCOUNTER — Encounter: Payer: Self-pay | Admitting: Nurse Practitioner

## 2018-03-31 ENCOUNTER — Ambulatory Visit: Payer: Self-pay | Attending: Nurse Practitioner | Admitting: Nurse Practitioner

## 2018-03-31 VITALS — BP 119/86 | HR 69 | Temp 98.0°F | Ht 61.0 in | Wt 164.8 lb

## 2018-03-31 DIAGNOSIS — H9313 Tinnitus, bilateral: Secondary | ICD-10-CM

## 2018-03-31 DIAGNOSIS — F419 Anxiety disorder, unspecified: Secondary | ICD-10-CM

## 2018-03-31 DIAGNOSIS — Z8739 Personal history of other diseases of the musculoskeletal system and connective tissue: Secondary | ICD-10-CM

## 2018-03-31 DIAGNOSIS — F320 Major depressive disorder, single episode, mild: Secondary | ICD-10-CM

## 2018-03-31 DIAGNOSIS — F32A Depression, unspecified: Secondary | ICD-10-CM

## 2018-03-31 DIAGNOSIS — F329 Major depressive disorder, single episode, unspecified: Secondary | ICD-10-CM

## 2018-03-31 MED ORDER — ESCITALOPRAM OXALATE 20 MG PO TABS: 20.0000 mg | ORAL_TABLET | Freq: Every day | ORAL | 0 refills | Status: DC

## 2018-03-31 NOTE — Progress Notes (Signed)
Assessment & Plan:  Rose Robertson was seen today for anxiety and depression.  Diagnoses and all orders for this visit:  History of fibromyalgia -     Cancel: Ambulatory referral to Rheumatology -     CBC -     CMP14+EGFR -     Lipid panel -     Ambulatory referral to Neurology  Tinnitus of both ears -     Ambulatory referral to ENT  Anxiety and depression  Current mild episode of major depressive disorder without prior episode ([place])  Other orders -     escitalopram (LEXAPRO) 20 MG tablet; Take 1 tablet (20 mg total) by mouth daily for 30 days.    Patient has been counseled on age-appropriate routine health concerns for screening and prevention. These are reviewed and up-to-date. Referrals have been placed accordingly. Immunizations are up-to-date or declined.    Subjective:   Chief Complaint  Patient presents with  . Anxiety  . Depression   HPI Rose Robertson 58 y.o. female presents to office today for follow up to anxiety and depression. She has along history of multiple somatic complaints and has been evaluated by numerous specialists in the past. She endorses a history of fibromyalgia however I do not have any records of the initial diagnosis. I have instructed her that I do not treat or diagnose fibromyalgia and she will either need to be referred to neurology for her chronic pain or pain management. To note she has been trying to apply for disability since 2012 due to her various symptoms of pain, headaches, tinnitus and general malaise. She states all of her symptoms are somehow related to her past breast reconstruction. She has seen Dr. Jacelyn Grip in the past with [facility] in  2012. EEG was negative at that time (patient reported "zoning out" episodes" and it was recommended that she be started on Cymbalta for her migraines and "fibromyalgia".  It appears from review of office notes from Neurology that she could not tolerate the cymbalta due to mild nausea  the first few days that she took it.   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  Her headaches seem to be causing significant disability.  I have offered her Cymbalta '30mg'$  -> '60mg'$  to see if this helps as a preventative for her headaches.  This may also help with her "fibromyalgia".  She will use Aleve as an abortive for her headaches.  At a later date I may consider an EMG/NCS for her left arm sensory changes, but again I think this is going to be unrevealing.  I will also await the results of her memory testing, but I suspect this will show a significant amount of contribution from her underlying mood disorder.   Anxiety and Depression Requesting to start back on lexapro. '20mg'$  which she has taken in the past. She states the zoloft caused palpitations, arrythmia and insomnia. She denies any thoughts of self harm.  Also currently taking gabapentin 300 mg TID.  Depression screen [facility] 2/9 03/31/2018 02/17/2018 08/15/2017 03/28/2017 05/30/2014  Decreased Interest '3 2 1 3 3  '$ Down, Depressed, Hopeless '3 3 3 3 3  '$ PHQ - 2 Score '6 5 4 6 6  '$ Altered sleeping '2 3 3 3 3  '$ Tired, decreased energy '3 2 1 3 3  '$ Change in appetite 2 1 - 2 1  Feeling bad or failure about yourself  '3 3 1 '$ 3  2  Trouble concentrating '2 2 2 3 2  '$ Moving slowly or fidgety/restless 0 '2 1 1 1  '$ Suicidal thoughts 0 0 0 0 3  PHQ-9 Score '18 18 12 21 21    '$ Tinnitus: Patient presents with complaints of chronic tinnitus. Onset of symptoms was 17 years ago with unchanged course since that time. Patient describes the tinnitus as fluctuating located in the bilateral ear. The quality is described as variable pitch. The intensity is loud. Patient describes her level of annoyance as moderately annoying, always aware. Associated symptoms include dizziness Family history is negative family history for tinnitus Patient has had no prior evaluation, treatment or surgery for  tinnitus Patient does not have hearing aids at this time. Previous treatments include none. She is currently taking lexapro and neurontin.   Review of Systems  Constitutional: Positive for malaise/fatigue. Negative for fever and weight loss.  HENT: Positive for tinnitus. Negative for nosebleeds.   Eyes: Negative.  Negative for blurred vision, double vision and photophobia.  Respiratory: Negative.  Negative for cough and shortness of breath.   Cardiovascular: Negative.  Negative for chest pain, palpitations and leg swelling.  Gastrointestinal: Positive for heartburn. Negative for nausea and vomiting.  Musculoskeletal: Positive for joint pain and myalgias.  Neurological: Positive for headaches. Negative for dizziness, focal weakness and seizures.       Chronic pain  Psychiatric/Behavioral: Positive for depression and memory loss. Negative for suicidal ideas. The patient is nervous/anxious and has insomnia.     Past Medical History:  Diagnosis Date  . Anal fissure   . Asthma Dx 1991   no inhaler  . C. difficile diarrhea   . Depression    At age of 27  . Dysrhythmia    tachycardia -controlled by med  . Fibromyalgia Dx 2010  . GERD (gastroesophageal reflux disease) Dx 2008  . Hyperlipidemia Dx 2010  . Hypertension Dx 2010  . Infection of breast implant ([place]) 3094-0768   staph and fungemia. breat removed in 2012 in [place]   . Pancreatitis   . Pneumonia   . Spinal headache 1990   BP dropped after spinal anes.  . 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 Tailbone injury 1990   broken   . Urinary incontinence, stress     Past Surgical History:  Procedure Laterality Date  . ABDOMINOPLASTY  1997  . ANAL FISSURE REPAIR  1985  . APPENDECTOMY  1987  . BREAST BIOPSY  2011  . BREAST ENHANCEMENT SURGERY  1997  . BREAST IMPLANT REMOVAL  6/12   implants and benign breast lumps removed  . CHOLECYSTECTOMY  1991  . LAPAROSCOPIC ASSISTED VAGINAL  HYSTERECTOMY  12/20/2011   Bilateral Salpingo-Oophorectomy  . TONSILLECTOMY  1978    Family History  Problem Relation Age of Onset  . Coronary artery disease Mother 61  . Hypertension Mother   . Stroke Mother   . Hypertension Father   . Diabetes Father        type 2  . Dementia Father   . Stroke Father        deceased, passed away had over 100 mini strokes  . Ovarian cancer Maternal Aunt 80  . Colon polyps Sister   . Diabetes Paternal Uncle   . Diabetes Paternal Grandmother     Social History Reviewed with no changes to be made today.   Outpatient Medications Prior to Visit  Medication Sig Dispense Refill  . atorvastatin (LIPITOR)  20 MG tablet TAKE 1 TABLET BY MOUTH DAILY. 30 tablet 2  . cholecalciferol (VITAMIN D) 1000 UNITS tablet Take 5,000 Units by mouth daily.     . Cyanocobalamin (VITAMIN B-12 PO) Take by mouth.    . fish oil-omega-3 fatty acids 1000 MG capsule Take 1,200 mg by mouth daily.    Marland Kitchen gabapentin (NEURONTIN) 300 MG capsule TAKE 1 CAPSULE (300 MG TOTAL) BY MOUTH 3 (THREE) TIMES DAILY. 90 capsule 3  . mupirocin ointment (BACTROBAN) 2 % Apply to affected area 2 (two) times per day for 7-10 days. 30 g 0  . nebivolol (BYSTOLIC) 5 MG tablet Take 1 tablet (5 mg total) by mouth daily. 90 tablet 0  . pantoprazole (PROTONIX) 40 MG tablet Take 1 tablet (40 mg total) by mouth daily. 90 tablet 1  . tiZANidine (ZANAFLEX) 4 MG capsule Take 1 capsule (4 mg total) by mouth 3 (three) times daily. 60 capsule 3  . milk thistle 175 MG tablet Take 175 mg by mouth daily.    . OREGANO PO Take 460 mg by mouth 2 (two) times daily.    . sertraline (ZOLOFT) 50 MG tablet Take 1 tablet (50 mg total) by mouth daily. (Patient not taking: Reported on 03/31/2018) 30 tablet 3   No facility-administered medications prior to visit.     Allergies  Allergen Reactions  . Sulfa Antibiotics     Remo Lipps Johnson's syndrome  . Adhesive [Tape] Other (See Comments)    Blisters   . Latex     rash  .  Pineapple Flavor        Objective:    BP 119/86   Pulse 69   Temp 98 F (36.7 C) (Oral)   Ht '5\' 1"'$  (1.549 m)   Wt 164 lb 12.8 oz (74.8 kg)   LMP 12/05/2011   SpO2 98%   BMI 31.14 kg/m  Wt Readings from Last 3 Encounters:  03/31/18 164 lb 12.8 oz (74.8 kg)  02/17/18 158 lb 3.2 oz (71.8 kg)  10/26/17 151 lb (68.5 kg)    Physical Exam Vitals signs and nursing note reviewed.  Constitutional:      Appearance: She is well-developed.  HENT:     Head: Normocephalic and atraumatic.  Neck:     Musculoskeletal: Normal range of motion.  Cardiovascular:     Rate and Rhythm: Normal rate and regular rhythm.     Heart sounds: Normal heart sounds. No murmur. No friction rub. No gallop.   Pulmonary:     Effort: Pulmonary effort is normal. No tachypnea or respiratory distress.     Breath sounds: Normal breath sounds. No decreased breath sounds, wheezing, rhonchi or rales.  Chest:     Chest wall: No tenderness.  Abdominal:     General: Bowel sounds are normal.     Palpations: Abdomen is soft.  Musculoskeletal: Normal range of motion.  Skin:    General: Skin is warm and dry.  Neurological:     Mental Status: She is alert and oriented to person, place, and time.     Coordination: Coordination normal.  Psychiatric:        Behavior: Behavior normal. Behavior is cooperative.        Thought Content: Thought content normal.        Judgment: Judgment normal.          Patient has been counseled extensively about nutrition and exercise as well as the importance of adherence with medications and regular follow-up. The patient was given  clear instructions to go to ER or return to medical center if symptoms don't improve, worsen or new problems develop. The patient verbalized understanding.   Follow-up: Return in about 3 months (around 06/29/2018) for Depression.   Gildardo Pounds, FNP-BC [facility] and [facility] [place], [place]   04/01/2018, 11:22  AM

## 2018-04-01 ENCOUNTER — Encounter: Payer: Self-pay | Admitting: Nurse Practitioner

## 2018-04-01 DIAGNOSIS — F320 Major depressive disorder, single episode, mild: Secondary | ICD-10-CM | POA: Insufficient documentation

## 2018-04-01 HISTORY — DX: Major depressive disorder, single episode, mild: F32.0

## 2018-04-01 LAB — CMP14+EGFR
ALT: 23 IU/L (ref 0–32)
AST: 15 IU/L (ref 0–40)
Albumin/Globulin Ratio: 2 (ref 1.2–2.2)
Albumin: 4.5 g/dL (ref 3.8–4.9)
Alkaline Phosphatase: 82 IU/L (ref 39–117)
BUN/Creatinine Ratio: 18 (ref 9–23)
BUN: 12 mg/dL (ref 6–24)
Bilirubin Total: <0.2 mg/dL (ref 0.0–1.2)
CO2: 25 mmol/L (ref 20–29)
Calcium: 9.6 mg/dL (ref 8.7–10.2)
Chloride: 101 mmol/L (ref 96–106)
Creatinine, Ser: 0.68 mg/dL (ref 0.57–1.00)
GFR calc Af Amer: 112 mL/min/{1.73_m2} (ref >59)
GFR calc non Af Amer: 97 mL/min/{1.73_m2} (ref >59)
Globulin, Total: 2.2 g/dL (ref 1.5–4.5)
Glucose: 85 mg/dL (ref 65–99)
Potassium: 4.3 mmol/L (ref 3.5–5.2)
Sodium: 140 mmol/L (ref 134–144)
Total Protein: 6.7 g/dL (ref 6.0–8.5)

## 2018-04-01 LAB — CBC
Hematocrit: 38.3 % (ref 34.0–46.6)
Hemoglobin: 13.5 g/dL (ref 11.1–15.9)
MCH: 31.4 pg (ref 26.6–33.0)
MCHC: 35.2 g/dL (ref 31.5–35.7)
MCV: 89 fL (ref 79–97)
Platelets: 345 10*3/uL (ref 150–450)
RBC: 4.3 x10E6/uL (ref 3.77–5.28)
RDW: 12.5 % (ref 11.7–15.4)
WBC: 12.2 10*3/uL — ABNORMAL HIGH (ref 3.4–10.8)

## 2018-04-01 LAB — LIPID PANEL
Chol/HDL Ratio: 3.5 ratio (ref 0.0–4.4)
Cholesterol, Total: 177 mg/dL (ref 100–199)
HDL: 51 mg/dL (ref >39)
LDL Calculated: 75 mg/dL (ref 0–99)
Triglycerides: 254 mg/dL — ABNORMAL HIGH (ref 0–149)
VLDL Cholesterol Cal: 51 mg/dL — ABNORMAL HIGH (ref 5–40)

## 2018-04-03 ENCOUNTER — Telehealth: Payer: Self-pay | Admitting: *Deleted

## 2018-04-03 NOTE — Telephone Encounter (Addendum)
Pt name and DOB verified. Patient aware of results and result note per Geryl Rankins, FNP.  Pt states she already partakes in the measures suggested  Along with taking medication for cholesterol: Omega 3 Fatty acid and Lipitor.   She does feel that she can stand more exercise. Pt states her family has heart disease.   Requested other preventive measures to lower cholesterol. Please advise    ----- Message from Gildardo Pounds, NP sent at 04/01/2018 11:44 AM EST ----- Triglycerides are elevated. The best way to keep your cholesterol levels down are to avoid or reduce beef or red meat, fried foods. junk foods including potato chips, unhealthy snacking. Your levels could likely be due to something you ate the day of your lab draw.

## 2018-04-06 NOTE — Telephone Encounter (Signed)
Noted. No other measures to take aside from more exercise and weight loss.

## 2018-04-06 NOTE — Telephone Encounter (Signed)
Pt aware of message, verified understanding.

## 2018-05-11 ENCOUNTER — Encounter: Payer: Self-pay | Admitting: Interventional Cardiology

## 2018-05-15 ENCOUNTER — Other Ambulatory Visit: Payer: Self-pay | Admitting: Nurse Practitioner

## 2018-05-18 ENCOUNTER — Telehealth: Payer: Self-pay

## 2018-05-18 NOTE — Telephone Encounter (Signed)
Appointment Cancelled due to Coronavirus:  Called patient in regards to New Patient appointment for HTN/Family Hx of Heart Disease on 05/22/18.   Patient states that her BP has been "fine". Patient denies having any chest pain, SOB, increasing edema, wt gain, or increase in abdominal girth, syncope, or any other Sx.  Patient wants to cancel appointment. She has been made aware that they will be contacted in the near future to reschedule.   Patient understands to let us know if they develop any Sx before then.   Will route to "CV DIV C19 CANCEL" pool.

## 2018-05-19 NOTE — Telephone Encounter (Signed)
3

## 2018-05-22 ENCOUNTER — Encounter

## 2018-05-22 ENCOUNTER — Ambulatory Visit: Payer: Self-pay | Admitting: Interventional Cardiology

## 2018-06-20 ENCOUNTER — Other Ambulatory Visit: Payer: Self-pay | Admitting: Nurse Practitioner

## 2018-06-20 DIAGNOSIS — E782 Mixed hyperlipidemia: Secondary | ICD-10-CM

## 2018-06-23 NOTE — Telephone Encounter (Signed)
Virtual Visit Pre-Appointment Phone Call TELEPHONE CALL NOTE  Rose Robertson has been deemed a candidate for a follow-up tele-health visit to limit community exposure during the Covid-19 pandemic. I spoke with the patient via phone to ensure availability of phone/video source, confirm preferred email & phone number, and discuss instructions and expectations.  I reminded Rose Robertson to be prepared with any vital sign and/or heart rhythm information that could potentially be obtained via home monitoring, at the time of her visit. I reminded Rose Robertson to expect a phone call prior to her visit.  Patient agrees with consent below.  Cleon Gustin, RN 06/23/2018 4:48 PM   INSTRUCTIONS FOR DOWNLOADING THE MYCHART APP TO SMARTPHONE  - The patient must first make sure to have activated MyChart and know their login information - If Apple, go to [employer] and type in MyChart in the search bar and download the app. If Android, ask patient to go to [employer] and type in [place] in the search bar and download the app. The app is free but as with any other app downloads, their phone may require them to verify saved payment information or Apple/Android password.  - The patient will need to then log into the app with their MyChart username and password, and select [facility] as their healthcare provider to link the account. When it is time for your visit, go to the MyChart app, find appointments, and click Begin Video Visit. Be sure to Select Allow for your device to access the Microphone and Camera for your visit. You will then be connected, and your provider will be with you shortly.  **If they have any issues connecting, or need assistance please contact MyChart service desk [phone])**  **If using a computer, in order to ensure the best quality for their visit they will need to use either of the following Internet Browsers:  [employer], or Google Chrome**  IF USING DOXIMITY or DOXY.ME - The patient will receive a link just prior to their visit by text.     FULL LENGTH CONSENT FOR TELE-HEALTH VISIT   I hereby voluntarily request, consent and authorize [facility] and its employed or contracted physicians, physician assistants, nurse practitioners or other licensed health care professionals (the Practitioner), to provide me with telemedicine health care services (the "Services") as deemed necessary by the treating Practitioner. I acknowledge and consent to receive the Services by the Practitioner via telemedicine. I understand that the telemedicine visit will involve communicating with the Practitioner through live audiovisual communication technology and the disclosure of certain medical information by electronic transmission. I acknowledge that I have been given the opportunity to request an in-person assessment or other available alternative prior to the telemedicine visit and am voluntarily participating in the telemedicine visit.  I understand that I have the right to withhold or withdraw my consent to the use of telemedicine in the course of my care at any time, without affecting my right to future care or treatment, and that the Practitioner or I may terminate the telemedicine visit at any time. I understand that I have the right to inspect all information obtained and/or recorded in the course of the telemedicine visit and may receive copies of available information for a reasonable fee.  I understand that some of the potential risks of receiving the Services via telemedicine include:  Marland Kitchen Delay or interruption in medical evaluation due to technological equipment failure or disruption; . Information transmitted may  not be sufficient (e.g. poor resolution of images) to allow for appropriate medical decision making by the Practitioner; and/or  . In rare instances, security protocols could fail, causing a breach of  personal health information.  Furthermore, I acknowledge that it is my responsibility to provide information about my medical history, conditions and care that is complete and accurate to the best of my ability. I acknowledge that Practitioner's advice, recommendations, and/or decision may be based on factors not within their control, such as incomplete or inaccurate data provided by me or distortions of diagnostic images or specimens that may result from electronic transmissions. I understand that the practice of medicine is not an exact science and that Practitioner makes no warranties or guarantees regarding treatment outcomes. I acknowledge that I will receive a copy of this consent concurrently upon execution via email to the email address I last provided but may also request a printed copy by calling the office of [facility].    I understand that my insurance will be billed for this visit.   I have read or had this consent read to me. . I understand the contents of this consent, which adequately explains the benefits and risks of the Services being provided via telemedicine.  . I have been provided ample opportunity to ask questions regarding this consent and the Services and have had my questions answered to my satisfaction. . I give my informed consent for the services to be provided through the use of telemedicine in my medical care  By participating in this telemedicine visit I agree to the above.

## 2018-07-03 ENCOUNTER — Ambulatory Visit: Payer: Self-pay | Admitting: Nurse Practitioner

## 2018-07-27 ENCOUNTER — Encounter: Payer: Self-pay | Admitting: Interventional Cardiology

## 2018-07-27 ENCOUNTER — Telehealth (INDEPENDENT_AMBULATORY_CARE_PROVIDER_SITE_OTHER): Payer: Self-pay | Admitting: Interventional Cardiology

## 2018-07-27 ENCOUNTER — Other Ambulatory Visit: Payer: Self-pay

## 2018-07-27 VITALS — BP 101/75 | HR 87 | Ht 61.0 in | Wt 160.0 lb

## 2018-07-27 DIAGNOSIS — Z720 Tobacco use: Secondary | ICD-10-CM

## 2018-07-27 DIAGNOSIS — E782 Mixed hyperlipidemia: Secondary | ICD-10-CM

## 2018-07-27 DIAGNOSIS — R072 Precordial pain: Secondary | ICD-10-CM

## 2018-07-27 NOTE — Patient Instructions (Addendum)
Medication Instructions:  Your physician recommends that you continue on your current medications as directed. Please refer to the Current Medication list given to you today.  If you need a refill on your cardiac medications before your next appointment, please call your pharmacy.   Lab work: Your physician recommends that you return for lab work Artist) prior to Cardiac CT   If you have labs (blood work) drawn today and your tests are completely normal, you will receive your results only by: Marland Kitchen MyChart Message (if you have MyChart) OR . A paper copy in the mail If you have any lab test that is abnormal or we need to change your treatment, we will call you to review the results.  Testing/Procedures: Your physician has requested that you have cardiac CT. Cardiac computed tomography (CT) is a painless test that uses an x-ray machine to take clear, detailed pictures of your heart. For further information please visit [email or web address]. Please follow instruction sheet as given.   Follow-Up: Based on test results  Any Other Special Instructions Will Be Listed Below (If Applicable).  CARDIAC CT INSTRUCTIONS  Please arrive at the [facility] main entrance of [facility] on ___ at ___ (30-45 minutes prior to test start time)  [facility] [place], [place] [address] [phone]  Proceed to the [facility] Radiology Department (First Floor).  Please follow these instructions carefully (unless otherwise directed):   On the Night Before the Test: . Be sure to Drink plenty of water. . Do not consume any caffeinated/decaffeinated beverages or chocolate 12 hours prior to your test. . Do not take any antihistamines 12 hours prior to your test.  On the Day of the Test: . Drink plenty of water. Do not drink any water within one hour of the test. . Do not eat any food 4 hours prior to the test. . You may take your regular medications prior to the test.   . Take nebivolol (bystolic) 10 mg (instead of your regular 5 mg) two hours prior to your test     After the Test: . Drink plenty of water. . After receiving IV contrast, you may experience a mild flushed feeling. This is normal. . On occasion, you may experience a mild rash up to 24 hours after the test. This is not dangerous. If this occurs, you can take Benadryl 25 mg and increase your fluid intake. . If you experience trouble breathing, this can be serious. If it is severe call 911 IMMEDIATELY. If it is mild, please call our office.

## 2018-07-27 NOTE — Progress Notes (Signed)
Virtual Visit via Video Note   This visit type was conducted due to national recommendations for restrictions regarding the COVID-19 Pandemic (e.g. social distancing) in an effort to limit this patient's exposure and mitigate transmission in our community.  Due to her co-morbid illnesses, this patient is at least at moderate risk for complications without adequate follow up.  This format is felt to be most appropriate for this patient at this time.  All issues noted in this document were discussed and addressed.  A limited physical exam was performed with this format.  Please refer to the patient's chart for her consent to telehealth for [facility].   Date:  07/27/2018   ID:  Rose Robertson, DOB 21-Sep-1960, MRN [id number]  Patient Location: Home Provider Location: Home  PCP:  Gildardo Pounds, NP  Cardiologist:  No primary care provider on file. new Electrophysiologist:  None   Evaluation Performed:  Consultation - Rose Robertson was referred by Dr. Raul Del for the evaluation of mixed hyperlipidemia.  Chief Complaint:  Elevated TG  History of Present Illness:    Rose Robertson is a 58 y.o. female with hyperlipidemia.  She has been treated for hyperlipidemia with atorvastatin for 10 years; started while she was in [place].  Mother had MI at a young age.  Brother with stents.   She has had some intermittent chest discomfort, burning in the cester of her chest.   She is also bothered by fibromyalgia, which limits her exercise.   Denies : Leg edema. Nitroglycerin use. Orthopnea. Palpitations. Paroxysmal nocturnal dyspnea. Syncope.  She does report frequent sweating.   She checks a daily ECG with Kardia.  Normal.   The patient does not have symptoms concerning for COVID-19 infection (fever, chills, cough, or new shortness of breath).    Past Medical History:  Diagnosis Date  . Abdominal wall pain in left flank 05/21/2014  . Anal fissure    . Asthma Dx 1991   no inhaler  . ASTHMA 06/03/2009   Qualifier: Diagnosis of  By: Elease Hashimoto MD, Bruce    . BREAST IMPLANTS, BILATERAL, HX OF 10/01/2009   Qualifier: Diagnosis of  By: Elease Hashimoto MD, Bruce    . C. difficile diarrhea   . Chronic left hip pain 05/22/2014  . Complete tear of right rotator cuff 11/22/2017  . Current mild episode of major depressive disorder without prior episode ([place]) 04/01/2018  . Depression    At age of 57  . DEPRESSION 06/03/2009   Qualifier: Diagnosis of  By: Elease Hashimoto MD, Bruce    . 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 Dysrhythmia    tachycardia -controlled by med  . FH: colon polyps 12/30/2010  . Fibromyalgia Dx 2010  . Fibromyalgia syndrome 11/06/2010  . GERD (gastroesophageal reflux disease) Dx 2008  . HEADACHES, HX OF 06/30/2009   Qualifier: Diagnosis of  By: Elease Hashimoto MD, Bruce    . Healthcare maintenance 05/30/2014  . Hyperlipidemia Dx 2010  . Hypertension Dx 2010  . Infection of breast implant ([place]) [phone]   staph and fungemia. breat removed in 2012 in [place]   . Left-sided chest wall pain 05/29/2014  . Obesity (BMI 30.0-34.9) 05/22/2014  . Osteoarthritis of both hands 05/30/2014  . Pancreatitis   . Pneumonia   . Right maxillary sinusitis, chronic 08/27/2009   Qualifier: Diagnosis of  By: Valma Cava LPN, Izora Gala    . S/P complete hysterectomy 05/30/2014  . Spinal headache 1990   BP dropped after spinal anes.  . 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 Tailbone injury 1990   broken   . URINARY INCONTINENCE 06/03/2009   Qualifier: Diagnosis of  By: Elease Hashimoto MD, Bruce    . Urinary incontinence, stress    Past Surgical History:  Procedure Laterality Date  . ABDOMINOPLASTY  1997  . ANAL FISSURE REPAIR  1985  . APPENDECTOMY  1987  . BREAST BIOPSY  2011  . BREAST ENHANCEMENT SURGERY  1997  . BREAST IMPLANT REMOVAL  6/12   implants and benign breast lumps removed  . CHOLECYSTECTOMY  1991  . LAPAROSCOPIC  ASSISTED VAGINAL HYSTERECTOMY  12/20/2011   Bilateral Salpingo-Oophorectomy  . TONSILLECTOMY  1978     Current Meds  Medication Sig  . atorvastatin (LIPITOR) 20 MG tablet TAKE 1 TABLET BY MOUTH DAILY.  . cholecalciferol (VITAMIN D) 1000 UNITS tablet Take 5,000 Units by mouth daily.   . Cyanocobalamin (VITAMIN B-12 PO) Take by mouth.  . fish oil-omega-3 fatty acids 1000 MG capsule Take 1,200 mg by mouth daily.  Marland Kitchen gabapentin (NEURONTIN) 300 MG capsule TAKE 1 CAPSULE (300 MG TOTAL) BY MOUTH 3 (THREE) TIMES DAILY. (Patient taking differently: Take 300 mg by mouth 2 (two) times daily. )  . milk thistle 175 MG tablet Take 175 mg by mouth daily.  . mupirocin ointment (BACTROBAN) 2 % Apply to affected area 2 (two) times per day for 7-10 days.  . nebivolol (BYSTOLIC) 5 MG tablet Take 1 tablet (5 mg total) by mouth daily.  . OREGANO PO Take 460 mg by mouth 2 (two) times daily.  . pantoprazole (PROTONIX) 40 MG tablet Take 1 tablet (40 mg total) by mouth daily. (Patient taking differently: Take 40 mg by mouth every other day. )  . tiZANidine (ZANAFLEX) 4 MG capsule Take 1 capsule (4 mg total) by mouth 3 (three) times daily. (Patient taking differently: Take 4 mg by mouth daily. Half a tab in the am and whole tab at night.)     Allergies:   Sulfa antibiotics; Adhesive [tape]; Latex; and Pineapple flavor   Social History   Tobacco Use  . Smoking status: Current Every Day Smoker    Packs/day: 0.10    Years: 34.00    Pack years: 3.40    Types: Cigarettes  . Smokeless tobacco: Never Used  Substance Use Topics  . Alcohol use: Yes    Comment: occasionally  . Drug use: No     Family Hx: The patient's family history includes Colon polyps in her sister; Coronary artery disease (age of onset: 109) in her mother; Dementia in her father; Diabetes in her father, paternal grandmother, and paternal uncle; Hypertension in her father and mother; Ovarian cancer (age of onset: 75) in her maternal aunt; Stroke  in her father and mother.  ROS:   Please see the history of present illness.    DOE All other systems reviewed and are negative.   Prior CV studies:   The following studies were reviewed today:    Labs/Other Tests and Data Reviewed:    EKG:  An ECG dated 2013 was personally reviewed today and demonstrated:  NSR, no ST changes  Recent Labs: 03/31/2018: ALT 23; BUN 12; Creatinine, Ser 0.68; Hemoglobin 13.5; Platelets 345; Potassium 4.3; Sodium 140   Recent Lipid Panel Lab Results  Component Value Date/Time   CHOL 177 03/31/2018 04:08 PM   TRIG 254 (H) 03/31/2018 04:08 PM   HDL 51 03/31/2018 04:08 PM   CHOLHDL 3.5  03/31/2018 04:08 PM   LDLCALC 75 03/31/2018 04:08 PM    Wt Readings from Last 3 Encounters:  07/27/18 160 lb (72.6 kg)  03/31/18 164 lb 12.8 oz (74.8 kg)  02/17/18 158 lb 3.2 oz (71.8 kg)     Objective:    Vital Signs:  BP 101/75   Pulse 87   Ht 5\' 1"  (1.549 m)   Wt 160 lb (72.6 kg)   LMP 12/05/2011   BMI 30.23 kg/m    VITAL SIGNS:  reviewed GEN:  no acute distress RESPIRATORY:  normal respiratory effort, symmetric expansion PSYCH:  normal affect exam limited due to video format  ASSESSMENT & PLAN:    1. Chest pain/DOE: Coronary CT.  DOE may be from deconditioning.Family h/o early CAD. Unable to exercise due to fibromyalgia.   Take extra bisoprolol 5 mg for 10 mg total, day of CT scan 2. Hyperlipidemia: Based on CT result, we can decide how aggressive to be with lipids/TG. 3. Smoking: She needs to stop smoking.  We discussed using Chantix to help her stop.  SHe only smokes 3-5 cigs/day.   COVID-19 Education: The signs and symptoms of COVID-19 were discussed with the patient and how to seek care for testing (follow up with PCP or arrange E-visit).  The importance of social distancing was discussed today.  Time:   Today, I have spent 25 minutes with the patient with telehealth technology discussing the above problems.     Medication  Adjustments/Labs and Tests Ordered: Current medicines are reviewed at length with the patient today.  Concerns regarding medicines are outlined above.   Tests Ordered: No orders of the defined types were placed in this encounter.   Medication Changes: No orders of the defined types were placed in this encounter.   Disposition:  Follow up for coronary CT  Signed, Larae Grooms, MD  07/27/2018 1:38 PM    [place]

## 2018-08-02 ENCOUNTER — Telehealth: Payer: Self-pay | Admitting: Nurse Practitioner

## 2018-08-02 NOTE — Telephone Encounter (Signed)
Pt called to request to speak with the financial dept.

## 2018-08-02 NOTE — Telephone Encounter (Signed)
Spoke with Pt about what they need to do to apply for CAFA

## 2018-08-07 ENCOUNTER — Ambulatory Visit: Payer: Self-pay | Attending: Nurse Practitioner

## 2018-08-07 ENCOUNTER — Other Ambulatory Visit: Payer: Self-pay

## 2018-08-07 ENCOUNTER — Other Ambulatory Visit: Payer: Self-pay | Admitting: Nurse Practitioner

## 2018-08-07 DIAGNOSIS — G8929 Other chronic pain: Secondary | ICD-10-CM

## 2018-08-07 DIAGNOSIS — M5440 Lumbago with sciatica, unspecified side: Secondary | ICD-10-CM

## 2018-08-07 DIAGNOSIS — M25511 Pain in right shoulder: Secondary | ICD-10-CM

## 2018-08-22 ENCOUNTER — Other Ambulatory Visit: Payer: Self-pay | Admitting: Nurse Practitioner

## 2018-08-22 ENCOUNTER — Ambulatory Visit: Payer: Self-pay | Attending: Nurse Practitioner

## 2018-08-22 ENCOUNTER — Other Ambulatory Visit: Payer: Self-pay

## 2018-08-22 DIAGNOSIS — R072 Precordial pain: Secondary | ICD-10-CM

## 2018-08-23 LAB — BASIC METABOLIC PANEL
BUN/Creatinine Ratio: 16 (ref 9–23)
BUN: 11 mg/dL (ref 6–24)
CO2: 22 mmol/L (ref 20–29)
Calcium: 9.8 mg/dL (ref 8.7–10.2)
Chloride: 102 mmol/L (ref 96–106)
Creatinine, Ser: 0.7 mg/dL (ref 0.57–1.00)
GFR calc Af Amer: 110 mL/min/{1.73_m2} (ref >59)
GFR calc non Af Amer: 96 mL/min/{1.73_m2} (ref >59)
Glucose: 122 mg/dL — ABNORMAL HIGH (ref 65–99)
Potassium: 4.1 mmol/L (ref 3.5–5.2)
Sodium: 138 mmol/L (ref 134–144)

## 2018-08-24 ENCOUNTER — Other Ambulatory Visit: Payer: Self-pay | Admitting: Nurse Practitioner

## 2018-08-28 ENCOUNTER — Other Ambulatory Visit: Payer: Self-pay | Admitting: Nurse Practitioner

## 2018-08-28 ENCOUNTER — Telehealth: Payer: Self-pay | Admitting: Nurse Practitioner

## 2018-08-28 NOTE — Telephone Encounter (Signed)
Patient called stating she is concerned why her antidepressant medication was discontinued. Patient states she has been off her medication for the past 4 days. Please follow up.

## 2018-08-28 NOTE — Telephone Encounter (Signed)
Patient was inform that during her televisit with cardiologist 05.28.2020 it was discontinue due to patient stated she was not taking it. Pt. Stated she believe it was her mistake for not understanding the medication and would like if PCP can refill her Lexapro.

## 2018-08-30 ENCOUNTER — Telehealth (HOSPITAL_COMMUNITY): Payer: Self-pay | Admitting: Emergency Medicine

## 2018-08-30 NOTE — Telephone Encounter (Signed)
Left message on voicemail with name and callback number Yolette Hastings RN Navigator Cardiac Imaging [facility] [phone] Office [phone] Cell  

## 2018-08-30 NOTE — Telephone Encounter (Signed)
Pt checking status of LEXAPRO refill. Pt has been out of medication for almost a week. [facility].

## 2018-08-31 ENCOUNTER — Ambulatory Visit (HOSPITAL_COMMUNITY)
Admission: RE | Admit: 2018-08-31 | Discharge: 2018-08-31 | Disposition: A | Discharge status: Home / Self Care | Payer: Self-pay | Source: Ambulatory Visit | Attending: Interventional Cardiology | Admitting: Interventional Cardiology

## 2018-08-31 ENCOUNTER — Other Ambulatory Visit: Payer: Self-pay

## 2018-08-31 DIAGNOSIS — R072 Precordial pain: Secondary | ICD-10-CM | POA: Insufficient documentation

## 2018-08-31 MED ORDER — NITROGLYCERIN 0.4 MG SL SUBL
0.8000 mg | SUBLINGUAL_TABLET | Freq: Once | SUBLINGUAL | Status: AC
  Administered 2018-08-31: 16:00:00 0.8 mg via SUBLINGUAL
  Filled 2018-08-31: qty 25

## 2018-08-31 MED ORDER — NITROGLYCERIN 0.4 MG SL SUBL
SUBLINGUAL_TABLET | SUBLINGUAL | Status: AC
  Filled 2018-08-31: qty 2

## 2018-08-31 MED ORDER — IOHEXOL 350 MG/ML SOLN
100.0000 mL | Freq: Once | INTRAVENOUS | Status: AC | PRN
  Administered 2018-08-31: 100 mL via INTRAVENOUS

## 2018-08-31 MED ORDER — METOPROLOL TARTRATE 5 MG/5ML IV SOLN
5.0000 mg | INTRAVENOUS | Status: DC | PRN
  Filled 2018-08-31: qty 5

## 2018-09-02 ENCOUNTER — Other Ambulatory Visit: Payer: Self-pay | Admitting: Nurse Practitioner

## 2018-09-02 MED ORDER — ESCITALOPRAM OXALATE 20 MG PO TABS: 20.0000 mg | ORAL_TABLET | Freq: Every day | ORAL | 1 refills | Status: DC

## 2018-09-02 NOTE — Telephone Encounter (Signed)
SENT 

## 2018-09-05 NOTE — Telephone Encounter (Signed)
[name] spoke to patient to inform that medication was sent.  Pt. Stated she had already picked it up.

## 2018-09-20 ENCOUNTER — Other Ambulatory Visit: Payer: Self-pay | Admitting: Nurse Practitioner

## 2018-09-20 DIAGNOSIS — G8929 Other chronic pain: Secondary | ICD-10-CM

## 2018-09-20 DIAGNOSIS — M5440 Lumbago with sciatica, unspecified side: Secondary | ICD-10-CM

## 2018-09-20 DIAGNOSIS — E782 Mixed hyperlipidemia: Secondary | ICD-10-CM

## 2018-09-22 NOTE — Telephone Encounter (Signed)
Pt contacted the office to check on her refills

## 2018-10-23 ENCOUNTER — Other Ambulatory Visit: Payer: Self-pay | Admitting: Nurse Practitioner

## 2018-10-23 DIAGNOSIS — M25511 Pain in right shoulder: Secondary | ICD-10-CM

## 2018-10-23 DIAGNOSIS — M5440 Lumbago with sciatica, unspecified side: Secondary | ICD-10-CM

## 2018-10-23 DIAGNOSIS — G8929 Other chronic pain: Secondary | ICD-10-CM

## 2018-11-28 ENCOUNTER — Other Ambulatory Visit: Payer: Self-pay | Admitting: Nurse Practitioner

## 2018-11-28 DIAGNOSIS — G8929 Other chronic pain: Secondary | ICD-10-CM

## 2018-11-29 ENCOUNTER — Telehealth: Payer: Self-pay | Admitting: Nurse Practitioner

## 2018-11-29 NOTE — Telephone Encounter (Signed)
Patient called stating she would like a referral to the following specialists.   Ophthalmologist - for cataract Rheumatologist - for her fibro Dermatologist - cannot recall Gastrologist - for diarrhea   States her appointments were cancelled due to [place].  Please follow up

## 2018-11-29 NOTE — Progress Notes (Signed)
Patient ID: Rose Robertson, female   DOB: 1960-03-13, 58 y.o.   MRN: [id number] Virtual Visit via Telephone Note  I connected with Rose Robertson on 11/29/18 at  1:30 PM EDT by telephone and verified that I am speaking with the correct person using two identifiers.   I discussed the limitations, risks, security and privacy concerns of performing an evaluation and management service by telephone and the availability of in person appointments. I also discussed with the patient that there may be a patient responsible charge related to this service. The patient expressed understanding and agreed to proceed.  Patient location:  home My Location:  Home office Persons on the call:  Me and the patient   History of Present Illness: R arm with tiny bumps with itching and burning and small area on face.  She has had multiple episodes of this over the last few weeks.  No fever.  Unsure if she came in to contact with something.    Also needs several referrals:  Eye doctor, GI for IBS, and rheumatology for fibromyalgia.      Observations/Objective:  A&Ox3   Assessment and Plan: 1. Dermatitis Does not sound like shingles.  Blood sugars have been normal - predniSONE (DELTASONE) 10 MG tablet; 6,5,4,3,2,1 take each days dose all at once in am with food  Dispense: 21 tablet; Refill: 0 - triamcinolone cream (KENALOG) 0.1 %; Apply 1 application topically 2 (two) times daily.  Dispense: 30 g; Refill: 0  2. Fibromyalgia syndrome - Ambulatory referral to Rheumatology  3. Irritable bowel syndrome, unspecified type - Ambulatory referral to Gastroenterology  4. Cataract of both eyes, unspecified cataract type - Ambulatory referral to Ophthalmology   Follow Up Instructions: 6 weeks see PCP   I discussed the assessment and treatment plan with the patient. The patient was provided an opportunity to ask questions and all were answered. The patient agreed with the plan and  demonstrated an understanding of the instructions.   The patient was advised to call back or seek an in-person evaluation if the symptoms worsen or if the condition fails to improve as anticipated.  I provided 16 minutes of non-face-to-face time during this encounter.   Freeman Caldron, PA-C

## 2018-11-30 ENCOUNTER — Telehealth: Payer: Self-pay | Admitting: Nurse Practitioner

## 2018-11-30 ENCOUNTER — Ambulatory Visit: Payer: Self-pay | Attending: Nurse Practitioner | Admitting: Physician Assistant

## 2018-11-30 ENCOUNTER — Encounter: Payer: Self-pay | Admitting: Gastroenterology

## 2018-11-30 DIAGNOSIS — L309 Dermatitis, unspecified: Secondary | ICD-10-CM

## 2018-11-30 DIAGNOSIS — H269 Unspecified cataract: Secondary | ICD-10-CM

## 2018-11-30 DIAGNOSIS — K589 Irritable bowel syndrome without diarrhea: Secondary | ICD-10-CM

## 2018-11-30 DIAGNOSIS — M797 Fibromyalgia: Secondary | ICD-10-CM

## 2018-11-30 MED ORDER — TRIAMCINOLONE ACETONIDE 0.1 % EX CREA: 1.0000 "application " | TOPICAL_CREAM | Freq: Two times a day (BID) | CUTANEOUS | 0 refills | Status: DC

## 2018-11-30 MED ORDER — PREDNISONE 10 MG PO TABS: ORAL_TABLET | ORAL | 0 refills | Status: DC

## 2018-11-30 NOTE — Telephone Encounter (Signed)
Pt. Requested referral has been placed.

## 2018-11-30 NOTE — Telephone Encounter (Signed)
1) Medication(s) Requested (by name): tizanidine 2) Pharmacy of Choice: [facility]    would like to know if approved. Please follow up.

## 2018-11-30 NOTE — Telephone Encounter (Signed)
Will route to PCP 

## 2018-12-03 ENCOUNTER — Other Ambulatory Visit: Payer: Self-pay | Admitting: Nurse Practitioner

## 2018-12-03 DIAGNOSIS — G8929 Other chronic pain: Secondary | ICD-10-CM

## 2018-12-03 MED ORDER — TIZANIDINE HCL 4 MG PO TABS: ORAL_TABLET | ORAL | 2 refills | Status: DC

## 2018-12-03 NOTE — Telephone Encounter (Signed)
Renewed.

## 2018-12-22 ENCOUNTER — Other Ambulatory Visit: Payer: Self-pay | Admitting: Nurse Practitioner

## 2018-12-22 DIAGNOSIS — E782 Mixed hyperlipidemia: Secondary | ICD-10-CM

## 2018-12-22 DIAGNOSIS — I10 Essential (primary) hypertension: Secondary | ICD-10-CM

## 2018-12-22 DIAGNOSIS — G8929 Other chronic pain: Secondary | ICD-10-CM

## 2019-01-02 ENCOUNTER — Other Ambulatory Visit: Payer: Self-pay

## 2019-01-02 ENCOUNTER — Ambulatory Visit (INDEPENDENT_AMBULATORY_CARE_PROVIDER_SITE_OTHER): Payer: Self-pay | Admitting: Gastroenterology

## 2019-01-02 ENCOUNTER — Encounter: Payer: Self-pay | Admitting: Gastroenterology

## 2019-01-02 VITALS — BP 130/80 | HR 76 | Temp 97.9°F | Ht 59.0 in | Wt 168.4 lb

## 2019-01-02 DIAGNOSIS — R197 Diarrhea, unspecified: Secondary | ICD-10-CM

## 2019-01-02 DIAGNOSIS — R109 Unspecified abdominal pain: Secondary | ICD-10-CM

## 2019-01-02 MED ORDER — HYOSCYAMINE SULFATE SL 0.125 MG SL SUBL: SUBLINGUAL_TABLET | SUBLINGUAL | 1 refills | Status: DC

## 2019-01-02 NOTE — Progress Notes (Signed)
Rose Robertson    [id number]    February 01, 1961  Primary Care Physician:Fleming, Vernia Buff, NP  Referring Physician: Argentina Donovan, PA-C [place],  [place] [address]   Chief complaint:  Diarrhea  HPI: 58 year old female previously followed by Dr. Sharlett Iles is here to reestablish care.  She was last seen in October 2012. She had breast implant in 1997, thinks she had mold infection in the implants.  She has not been feeling well since 2008, had them removed in 2012, 12 hour extensive surgery. She was on antibiotics for an year and half after the removal of the breast implants. She has been having chronic diarrhea since 2008  8-10 BM per day, diarrhea and symptoms are better when she drinks red wine.  Has most bowel movements in the morning usually before 1 pm  No nocturnal symptoms.   H/o pancreatitis in the remote past  Complains of associated lower abd pain cramping when she has to go multiple times  She takes Imodium intermittently as needed if she needs to be outside her home, tries to avoid taking it as it makes the abdominal cramping worse  Denies any nausea, vomiting, melena or bright red blood per rectum   Colonoscopy 12/30/2010: Normal.  No biopsies were taken  No family history of GI malignancy  Outpatient Encounter Medications as of 01/02/2019  Medication Sig   atorvastatin (LIPITOR) 20 MG tablet TAKE 1 TABLET BY MOUTH DAILY.   BYSTOLIC 5 MG tablet TAKE 1 TABLET (5 MG TOTAL) BY MOUTH DAILY.   cholecalciferol (VITAMIN D) 1000 UNITS tablet Take 5,000 Units by mouth daily.    Cyanocobalamin (VITAMIN B-12 PO) Take by mouth.   escitalopram (LEXAPRO) 20 MG tablet Take 1 tablet (20 mg total) by mouth daily.   fish oil-omega-3 fatty acids 1000 MG capsule Take 1,200 mg by mouth daily.   gabapentin (NEURONTIN) 300 MG capsule TAKE 1 CAPSULE (300 MG TOTAL) BY MOUTH 3 (THREE) TIMES DAILY.   OREGANO PO Take 460 mg by mouth 2 (two)  times daily.   pantoprazole (PROTONIX) 40 MG tablet Take 1 tablet (40 mg total) by mouth daily. (Patient taking differently: Take 40 mg by mouth every other day. )   tiZANidine (ZANAFLEX) 4 MG tablet TAKE 1 TABLET (4 MG TOTAL) BY MOUTH 3 (THREE) TIMES DAILY.   triamcinolone cream (KENALOG) 0.1 % Apply 1 application topically 2 (two) times daily. (Patient taking differently: Apply 1 application topically as needed. )   [DISCONTINUED] predniSONE (DELTASONE) 10 MG tablet 6,5,4,3,2,1 take each days dose all at once in am with food   No facility-administered encounter medications on file as of 01/02/2019.     Allergies as of 01/02/2019 - Review Complete 01/02/2019  Allergen Reaction Noted   Sulfa antibiotics  10/27/2010   Adhesive [tape] Other (See Comments) 12/20/2011   Latex  10/01/2010   Pineapple flavor  12/02/2011    Past Medical History:  Diagnosis Date   Abdominal wall pain in left flank 05/21/2014   Anal fissure    Asthma Dx 1991   no inhaler   ASTHMA 06/03/2009   Qualifier: Diagnosis of  By: Elease Hashimoto MD, Bruce     BREAST IMPLANTS, BILATERAL, HX OF 10/01/2009   Qualifier: Diagnosis of  By: Elease Hashimoto MD, Bruce     C. 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 breat removed in 2012 in [place]    Left-sided chest wall pain 05/29/2014   Obesity (BMI 30.0-34.9) 05/22/2014   Osteoarthritis of both hands 05/30/2014   Pancreatitis    Pneumonia    Right maxillary sinusitis, chronic 08/27/2009   Qualifier: Diagnosis of  By: Valma Cava LPN, Izora Gala     S/P complete hysterectomy 05/30/2014   Spinal headache 1990   BP dropped after spinal anes.   Stevens-Johnson syndrome ([place]) 1982   related to allergic rxn to sulfa drugs    Tailbone injury 1990   broken    URINARY INCONTINENCE 06/03/2009   Qualifier: Diagnosis of  By: Elease Hashimoto MD, Bruce     Urinary incontinence, stress     Past Surgical History:  Procedure Laterality Date   ABDOMINOPLASTY  [place]   BREAST BIOPSY  2011   BREAST ENHANCEMENT SURGERY  1997   BREAST IMPLANT REMOVAL  6/12   implants and benign breast lumps removed   CHOLECYSTECTOMY  1991   LAPAROSCOPIC ASSISTED VAGINAL HYSTERECTOMY  12/20/2011   Bilateral Salpingo-Oophorectomy   TONSILLECTOMY  1978    Family History  Problem Relation Age of Onset   Coronary artery disease Mother 8   Hypertension Mother    Stroke Mother    Heart disease Mother    Hypertension Father    Diabetes Father        type 2   Dementia Father    Stroke Father        deceased, passed away had over 7 mini strokes   Ovarian cancer Maternal Aunt 80   Colon polyps Sister    Diabetes Paternal Uncle    Diabetes Paternal Grandmother    Heart disease Maternal Grandmother    Prostate cancer Maternal Grandfather    Bone cancer Maternal Grandfather    Heart disease Maternal Aunt    Kidney disease Maternal Aunt    Heart disease Maternal Uncle    Heart disease Paternal Uncle    Other Daughter        neurocariogenic syndrome NCS Pots, NCAD, EDS, [id number]    Social History   Socioeconomic History   Marital status: Married    Spouse name: Not on file   Number of  children: 2   Years of education: Not on file   Highest education level: Not on file  Occupational History   Occupation: unemployed  Social Designer, fashion/clothing strain: Not on file   Food insecurity    Worry: Not on file    Inability: Not on file   Transportation needs    Medical: Not on file    Non-medical: Not on file  Tobacco Use   Smoking status: Current Every Day Smoker  Packs/day: 0.10    Years: 34.00    Pack years: 3.40    Types: Cigarettes   Smokeless tobacco: Never Used  Substance and Sexual Activity   Alcohol use: Yes    Comment: occasionally   Drug use: No   Sexual activity: Yes    Birth control/protection: None    Comment: ? post menopausal  Lifestyle   Physical activity    Days per week: Not on file    Minutes per session: Not on file   Stress: Not on file  Relationships   Social connections    Talks on phone: Not on file    Gets together: Not on file    Attends religious service: Not on file    Active member of club or organization: Not on file    Attends meetings of clubs or organizations: Not on file    Relationship status: Not on file   Intimate partner violence    Fear of current or ex partner: Not on file    Emotionally abused: Not on file    Physically abused: Not on file    Forced sexual activity: Not on file  Other Topics Concern   Not on file  Social History Narrative   Not on file      Review of systems: Review of Systems  Constitutional: Negative for fever and chills.  HENT: Negative.   Eyes: Negative for blurred vision.  Respiratory: Negative for cough, shortness of breath and wheezing.   Cardiovascular: Negative for chest pain and palpitations.  Gastrointestinal: as per HPI Genitourinary: Negative for dysuria, urgency, frequency and hematuria.  Musculoskeletal: Negative for myalgias, back pain and joint pain.  Skin: Negative for itching and rash.  Neurological: Negative for dizziness, tremors, focal  weakness, seizures and loss of consciousness.  Endo/Heme/Allergies: Positive for seasonal allergies.  Psychiatric/Behavioral: Negative for depression, suicidal ideas and hallucinations.  All other systems reviewed and are negative.   Physical Exam: Vitals:   01/02/19 1420  BP: 130/80  Pulse: 76  Temp: 97.9 F (36.6 C)   Body mass index is 34.01 kg/m. Gen:      No acute distress HEENT:  EOMI, sclera anicteric Neck:     No masses; no thyromegaly Lungs:    Clear to auscultation bilaterally; normal respiratory effort CV:         Regular rate and rhythm; no murmurs Abd:      + bowel sounds; soft, non-tender; no palpable masses, no distension Ext:    No edema; adequate peripheral perfusion Skin:      Warm and dry; no rash Neuro: alert and oriented x 3 Psych: normal mood and affect  Data Reviewed:  Reviewed labs, radiology imaging, old records and pertinent past GI work up   Assessment and Plan/Recommendations:  58 year old female with history of chronic diarrhea and fecal urgency  Check GI pathogen panel to exclude any infectious etiology Pancreatic elastase and fecal fat to exclude pancreatic insufficiency Fecal lactoferrin, if positive will consider repeat colonoscopy with biopsies to exclude colitis  Most likely etiology irritable bowel syndrome predominant diarrhea  If above work-up negative, will consider empiric trial with Colestid and Creon  Trial of lactose-free diet Start Benefiber 1 tablespoon 3 times daily with meals  Use Levsin 1 tablet up to 3 times daily as needed for abdominal cramping  Return in 4 to 6 weeks or sooner if needed  45 minutes was spent face-to-face with the patient. Greater than 50% of the time used for counseling as  well as treatment plan and follow-up. She had multiple questions which were answered to her satisfaction  K. Denzil Magnuson , MD    CC: Argentina Donovan, PA-C

## 2019-01-02 NOTE — Patient Instructions (Addendum)
Go to the basement for labs today  Take Benefiber 1 tablespoon three times a day with meals  We will send Levsin to your pharmacy   Lactose-Free Diet, Adult If you have lactose intolerance, you are not able to digest lactose. Lactose is a natural sugar found mainly in dairy milk and dairy products. You may need to avoid all foods and beverages that contain lactose. A lactose-free diet can help you do this. Which foods have lactose? Lactose is found in dairy milk and dairy products, such as:  Yogurt.  Cheese.  Butter.  Margarine.  Sour cream.  Cream.  Whipped toppings and nondairy creamers.  Ice cream and other dairy-based desserts. Lactose is also found in foods or products made with dairy milk or milk ingredients. To find out whether a food contains dairy milk or a milk ingredient, look at the ingredients list. Avoid foods with the statement "May contain milk" and foods that contain:  Milk powder.  Whey.  Curd.  Caseinate.  Lactose.  Lactalbumin.  Lactoglobulin. What are alternatives to dairy milk and foods made with milk products?  Lactose-free milk.  Soy milk with added calcium and vitamin D.  Almond milk, coconut milk, rice milk, or other nondairy milk alternatives with added calcium and vitamin D. Note that these are low in protein.  Soy products, such as soy yogurt, soy cheese, soy ice cream, and soy-based sour cream.  Other nut milk products, such as almond yogurt, almond cheese, cashew yogurt, cashew cheese, cashew ice cream, coconut yogurt, and coconut ice cream. What are tips for following this plan?  Do not consume foods, beverages, vitamins, minerals, or medicines containing lactose. Read ingredient lists carefully.  Look for the words "lactose-free" on labels.  Use lactase enzyme drops or tablets as directed by your health care provider.  Use lactose-free milk or a milk alternative, such as soy milk or almond milk, for drinking and cooking.   Make sure you get enough calcium and vitamin D in your diet. A lactose-free eating plan can be lacking in these important nutrients.  Take calcium and vitamin D supplements as directed by your health care provider. Talk to your health care provider about supplements if you are not able to get enough calcium and vitamin D from food. What foods can I eat?  Fruits All fresh, canned, frozen, or dried fruits that are not processed with lactose. Vegetables All fresh, frozen, and canned vegetables without cheese, cream, or butter sauces. Grains Any that are not made with dairy milk or dairy products. Meats and other proteins Any meat, fish, poultry, and other protein sources that are not made with dairy milk or dairy products. Soy cheese and yogurt. Fats and oils Any that are not made with dairy milk or dairy products. Beverages Lactose-free milk. Soy, rice, or almond milk with added calcium and vitamin D. Fruit and vegetable juices. Sweets and desserts Any that are not made with dairy milk or dairy products. Seasonings and condiments Any that are not made with dairy milk or dairy products. Calcium Calcium is found in many foods that contain lactose and is important for bone health. The amount of calcium you need depends on your age:  Adults younger than 50 years: 1,000 mg of calcium a day.  Adults older than 50 years: 1,200 mg of calcium a day. If you are not getting enough calcium, you may get it from other sources, including:  Orange juice with calcium added. There are 300-350 mg of calcium in  1 cup of orange juice.  Calcium-fortified soy milk. There are 300-400 mg of calcium in 1 cup of calcium-fortified soy milk.  Calcium-fortified rice or almond milk. There are 300 mg of calcium in 1 cup of calcium-fortified rice or almond milk.  Calcium-fortified breakfast cereals. There are 100-1,000 mg of calcium in calcium-fortified breakfast cereals.  Spinach, cooked. There are 145 mg of  calcium in  cup of cooked spinach.  Edamame, cooked. There are 130 mg of calcium in  cup of cooked edamame.  Collard greens, cooked. There are 125 mg of calcium in  cup of cooked collard greens.  Kale, frozen or cooked. There are 90 mg of calcium in  cup of cooked or frozen kale.  Almonds. There are 95 mg of calcium in  cup of almonds.  Broccoli, cooked. There are 60 mg of calcium in 1 cup of cooked broccoli. The items listed above may not be a complete list of recommended foods and beverages. Contact a dietitian for more options. What foods are not recommended? Fruits None, unless they are made with dairy milk or dairy products. Vegetables None, unless they are made with dairy milk or dairy products. Grains Any grains that are made with dairy milk or dairy products. Meats and other proteins None, unless they are made with dairy milk or dairy products. Dairy All dairy products, including milk, goat's milk, buttermilk, kefir, acidophilus milk, flavored milk, evaporated milk, condensed milk, dulce de [place], eggnog, yogurt, cheese, and cheese spreads. Fats and oils Any that are made with milk or milk products. Margarines and salad dressings that contain milk or cheese. Cream. Half and half. Cream cheese. Sour cream. Chip dips made with sour cream or yogurt. Beverages Hot chocolate. Cocoa with lactose. Instant iced teas. Powdered fruit drinks. Smoothies made with dairy milk or yogurt. Sweets and desserts Any that are made with milk or milk products. Seasonings and condiments Chewing gum that has lactose. Spice blends if they contain lactose. Artificial sweeteners that contain lactose. Nondairy creamers. The items listed above may not be a complete list of foods and beverages to avoid. Contact a dietitian for more information. Summary  If you are lactose intolerant, it means that you have a hard time digesting lactose, a natural sugar found in milk and milk products.  Following a  lactose-free diet can help you manage this condition.  Calcium is important for bone health and is found in many foods that contain lactose. Talk with your health care provider about other sources of calcium. This information is not intended to replace advice given to you by your health care provider. Make sure you discuss any questions you have with your health care provider. Document Released: 08/07/2001 Document Revised: 03/15/2017 Document Reviewed: 03/15/2017 Elsevier Patient Education  [place].  I appreciate the  opportunity to care for you  Thank You   Harl Bowie , MD

## 2019-01-09 ENCOUNTER — Encounter: Payer: Self-pay | Admitting: Gastroenterology

## 2019-01-15 NOTE — Progress Notes (Deleted)
Office Visit Note  Patient: Rose Robertson             Date of Birth: Jun 27, 1960           MRN: [id number]             PCP: Gildardo Pounds, NP Referring: Argentina Donovan, PA-C Visit Date: 01/29/2019 Occupation: @GUAROCC @  Subjective:  No chief complaint on file.   History of Present Illness: Rose Robertson is a 58 y.o. female ***   Activities of Daily Living:  Patient reports morning stiffness for *** {minute/hour:19697}.   Patient {ACTIONS;DENIES/REPORTS:21021675::"Denies"} nocturnal pain.  Difficulty dressing/grooming: {ACTIONS;DENIES/REPORTS:21021675::"Denies"} Difficulty climbing stairs: {ACTIONS;DENIES/REPORTS:21021675::"Denies"} Difficulty getting out of chair: {ACTIONS;DENIES/REPORTS:21021675::"Denies"} Difficulty using hands for taps, buttons, cutlery, and/or writing: {ACTIONS;DENIES/REPORTS:21021675::"Denies"}  No Rheumatology ROS completed.   PMFS History:  Patient Active Problem List   Diagnosis Date Noted  . Current mild episode of major depressive disorder without prior episode ([place]) 04/01/2018  . Complete tear of right rotator cuff 11/22/2017  . S/P complete hysterectomy 05/30/2014  . Healthcare maintenance 05/30/2014  . Osteoarthritis of both hands 05/30/2014  . Left-sided chest wall pain 05/29/2014  . Obesity (BMI 30.0-34.9) 05/22/2014  . Chronic left hip pain 05/22/2014  . Abdominal wall pain in left flank 05/21/2014  . FH: colon polyps 12/30/2010  . GERD (gastroesophageal reflux disease) 11/06/2010  . Fibromyalgia syndrome 11/06/2010  . 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 BREAST IMPLANTS, BILATERAL, HX OF 10/01/2009  . Right maxillary sinusitis, chronic 08/27/2009  . HEADACHES, HX OF 06/30/2009  . DEPRESSION 06/03/2009  . ASTHMA 06/03/2009  . URINARY INCONTINENCE 06/03/2009    Past Medical History:  Diagnosis Date  . Abdominal wall pain in left flank 05/21/2014  . Anal fissure   . Asthma Dx 1991   no inhaler   . ASTHMA 06/03/2009   Qualifier: Diagnosis of  By: Elease Hashimoto MD, Bruce    . BREAST IMPLANTS, BILATERAL, HX OF 10/01/2009   Qualifier: Diagnosis of  By: Elease Hashimoto MD, Bruce    . C. difficile diarrhea   . Chronic left hip pain 05/22/2014  . Complete tear of right rotator cuff 11/22/2017  . Current mild episode of major depressive disorder without prior episode ([place]) 04/01/2018  . Depression    At age of 8  . DEPRESSION 06/03/2009   Qualifier: Diagnosis of  By: Elease Hashimoto MD, Bruce    . 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 Dysrhythmia    tachycardia -controlled by med  . FH: colon polyps 12/30/2010  . Fibromyalgia Dx 2010  . Fibromyalgia syndrome 11/06/2010  . GERD (gastroesophageal reflux disease) Dx 2008  . HEADACHES, HX OF 06/30/2009   Qualifier: Diagnosis of  By: Elease Hashimoto MD, Bruce    . Healthcare maintenance 05/30/2014  . Hyperlipidemia Dx 2010  . Hypertension Dx 2010  . Infection of breast implant ([place]) [id number]   staph and fungemia. breat removed in 2012 in [place]   . Left-sided chest wall pain 05/29/2014  . Obesity (BMI 30.0-34.9) 05/22/2014  . Osteoarthritis of both hands 05/30/2014  . Pancreatitis   . Pneumonia   . Right maxillary sinusitis, chronic 08/27/2009   Qualifier: Diagnosis of  By: Valma Cava LPN, Izora Gala    . S/P complete hysterectomy 05/30/2014  . Spinal headache 1990   BP dropped after spinal anes.  . 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 Tailbone injury 1990   broken   . URINARY INCONTINENCE 06/03/2009   Qualifier: Diagnosis of  ByElease Hashimoto MD, Bruce    . Urinary incontinence, stress     Family History  Problem Relation Age of Onset  . Coronary artery disease Mother 39  . Hypertension Mother   . Stroke Mother   . Heart disease Mother   . Hypertension Father   . Diabetes Father        type 2  . Dementia Father   . Stroke Father        deceased, passed away had over 100 mini strokes  . Ovarian cancer Maternal Aunt 80   . Colon polyps Sister   . Diabetes Paternal Uncle   . Diabetes Paternal Grandmother   . Heart disease Maternal Grandmother   . Prostate cancer Maternal Grandfather   . Bone cancer Maternal Grandfather   . Heart disease Maternal Aunt   . Kidney disease Maternal Aunt   . Heart disease Maternal Uncle   . Heart disease Paternal Uncle   . Other Daughter        neurocariogenic syndrome NCS Pots, NCAD, EDS, [id number]   Past Surgical History:  Procedure Laterality Date  . ABDOMINOPLASTY  1997  . ANAL FISSURE REPAIR  1985  . APPENDECTOMY  1987  . BREAST BIOPSY  2011  . BREAST ENHANCEMENT SURGERY  1997  . BREAST IMPLANT REMOVAL  6/12   implants and benign breast lumps removed  . CHOLECYSTECTOMY  1991  . LAPAROSCOPIC ASSISTED VAGINAL HYSTERECTOMY  12/20/2011   Bilateral Salpingo-Oophorectomy  . TONSILLECTOMY  1978   Social History   Social History Narrative  . Not on file   Immunization History  Administered Date(s) Administered  . Pneumococcal Polysaccharide-23 12/21/2011  . Tdap 10/09/2010     Objective: Vital Signs: LMP 12/05/2011    Physical Exam   Musculoskeletal Exam: ***  CDAI Exam: CDAI Score: - Patient Global: -; Provider Global: - Swollen: -; Tender: - Joint Exam   No joint exam has been documented for this visit   There is currently no information documented on the homunculus. Go to the Rheumatology activity and complete the homunculus joint exam.  Investigation: No additional findings.  Imaging: No results found.  Recent Labs: Lab Results  Component Value Date   WBC 12.2 (H) 03/31/2018   HGB 13.5 03/31/2018   PLT 345 03/31/2018   NA 138 08/22/2018   K 4.1 08/22/2018   CL 102 08/22/2018   CO2 22 08/22/2018   GLUCOSE 122 (H) 08/22/2018   BUN 11 08/22/2018   CREATININE 0.70 08/22/2018   BILITOT <0.2 03/31/2018   ALKPHOS 82 03/31/2018   AST 15 03/31/2018   ALT 23 03/31/2018   PROT 6.7 03/31/2018   ALBUMIN 4.5 03/31/2018   CALCIUM 9.8  08/22/2018   GFRAA 110 08/22/2018    Speciality Comments: No specialty comments available.  Procedures:  No procedures performed Allergies: Sulfa antibiotics, Adhesive [tape], Latex, and Pineapple flavor   Assessment / Plan:     Visit Diagnoses: No diagnosis found.  Orders: No orders of the defined types were placed in this encounter.  No orders of the defined types were placed in this encounter.   Face-to-face time spent with patient was *** minutes. Greater than 50% of time was spent in counseling and coordination of care.  Follow-Up Instructions: No follow-ups on file.   Ofilia Neas, PA-C  Note - This record has been created using Dragon software.  Chart creation errors have been sought, but may not always  have been located. Such creation errors do not  reflect on  the standard of medical care.

## 2019-01-17 ENCOUNTER — Telehealth: Payer: Self-pay | Admitting: Nurse Practitioner

## 2019-01-17 NOTE — Telephone Encounter (Signed)
Patients father in law is dying and she has canceled her appointment she will call and reschedule when she can

## 2019-01-29 ENCOUNTER — Ambulatory Visit: Payer: Self-pay | Admitting: Rheumatology

## 2019-01-29 ENCOUNTER — Other Ambulatory Visit: Payer: Self-pay | Admitting: Nurse Practitioner

## 2019-01-29 ENCOUNTER — Other Ambulatory Visit: Payer: Self-pay

## 2019-01-29 DIAGNOSIS — M5440 Lumbago with sciatica, unspecified side: Secondary | ICD-10-CM

## 2019-01-29 DIAGNOSIS — G8929 Other chronic pain: Secondary | ICD-10-CM

## 2019-02-20 ENCOUNTER — Ambulatory Visit: Payer: Self-pay | Admitting: Rheumatology

## 2019-02-26 ENCOUNTER — Other Ambulatory Visit: Payer: Self-pay | Admitting: Nurse Practitioner

## 2019-02-26 DIAGNOSIS — K219 Gastro-esophageal reflux disease without esophagitis: Secondary | ICD-10-CM

## 2019-03-27 ENCOUNTER — Other Ambulatory Visit: Payer: Self-pay | Admitting: Nurse Practitioner

## 2019-03-27 ENCOUNTER — Other Ambulatory Visit: Payer: Self-pay | Admitting: Family Medicine

## 2019-03-27 DIAGNOSIS — M5440 Lumbago with sciatica, unspecified side: Secondary | ICD-10-CM

## 2019-03-27 DIAGNOSIS — E782 Mixed hyperlipidemia: Secondary | ICD-10-CM

## 2019-03-27 DIAGNOSIS — I10 Essential (primary) hypertension: Secondary | ICD-10-CM

## 2019-03-27 DIAGNOSIS — G8929 Other chronic pain: Secondary | ICD-10-CM

## 2019-03-28 NOTE — Telephone Encounter (Signed)
Patients father is passing away and had to cancel her appointment. Last lipid was a year ago and last OV was October 2020

## 2019-04-09 NOTE — Progress Notes (Signed)
Office Visit Note  Patient: Rose Robertson             Date of Birth: Apr 21, 1960           MRN: [id number]             PCP: Gildardo Pounds, NP Referring: Argentina Donovan, PA-C Visit Date: 04/11/2019 Occupation: @GUAROCC @  Subjective: Pain in multiple joints and muscles.   History of Present Illness: Rose Robertson is a 59 y.o. female seen in consultation per request of her PCP.  According to patient her symptoms started in 2008.  At the time she developed an infection in her breast implants.  She had oncology work-up which was negative.  She states the same time  all of her joints started hurting.  She started having burning sensation on her back.  All her muscles started becoming more painful.  Her symptoms got worse over the next few years.  In 2012 she had breast implants removed.  She states she did not notice any improvement in her joint pain.  She had some labs which were positive for ANA.  She was referred to a rheumatologist at [facility] who did extensive work-up and told her that she did not have lupus and diagnosed her with fibromyalgia syndrome.  She states she has been seeing her PCP since then.  She continues to have pain in her hands which is getting worse.  She also notices some swelling in her hands.  She has pain in all of her joints and muscles.  She states she has lower back pain and had MRI of her lumbar spine for which she has been taking many medications and is suffering from chronic pain.  Activities of Daily Living:  Patient reports morning stiffness for 24 hours.   Patient Reports nocturnal pain.  Difficulty dressing/grooming: Reports Difficulty climbing stairs: Reports Difficulty getting out of chair: Reports Difficulty using hands for taps, buttons, cutlery, and/or writing: Reports  Review of Systems  Constitutional: Positive for fatigue. Negative for night sweats, weight gain and weight loss.  HENT: Positive for  mouth dryness and nose dryness. Negative for mouth sores, trouble swallowing and trouble swallowing.   Eyes: Positive for dryness. Negative for pain, redness and visual disturbance.  Respiratory: Negative for cough, shortness of breath and difficulty breathing.   Cardiovascular: Negative for chest pain, palpitations, hypertension, irregular heartbeat and swelling in legs/feet.  Gastrointestinal: Positive for diarrhea. Negative for blood in stool and constipation.       H/o IBS, lactose intolerence  Endocrine: Negative for increased urination.  Genitourinary: Negative for difficulty urinating, painful urination and vaginal dryness.  Musculoskeletal: Positive for arthralgias, joint pain, myalgias, muscle weakness, morning stiffness and myalgias. Negative for joint swelling and muscle tenderness.  Skin: Positive for color change and redness. Negative for rash, hair loss, skin tightness, ulcers and sensitivity to sunlight.  Allergic/Immunologic: Negative for susceptible to infections.  Neurological: Positive for memory loss. Negative for dizziness, headaches, night sweats and weakness.  Hematological: Negative for bruising/bleeding tendency and swollen glands.  Psychiatric/Behavioral: Positive for confusion. Negative for depressed mood and sleep disturbance. The patient is nervous/anxious.     PMFS History:  Patient Active Problem List   Diagnosis Date Noted  . Current mild episode of major depressive disorder without prior episode ([place]) 04/01/2018  . Complete tear of right rotator cuff 11/22/2017  . S/P complete hysterectomy 05/30/2014  . Healthcare maintenance 05/30/2014  . Osteoarthritis of both hands 05/30/2014  .  Left-sided chest wall pain 05/29/2014  . Obesity (BMI 30.0-34.9) 05/22/2014  . Chronic left hip pain 05/22/2014  . Abdominal wall pain in left flank 05/21/2014  . FH: colon polyps 12/30/2010  . GERD (gastroesophageal reflux disease) 11/06/2010  . Fibromyalgia syndrome  11/06/2010  . 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 BREAST IMPLANTS, BILATERAL, HX OF 10/01/2009  . Right maxillary sinusitis, chronic 08/27/2009  . HEADACHES, HX OF 06/30/2009  . DEPRESSION 06/03/2009  . ASTHMA 06/03/2009  . URINARY INCONTINENCE 06/03/2009    Past Medical History:  Diagnosis Date  . Abdominal wall pain in left flank 05/21/2014  . Anal fissure   . Asthma Dx 1991   no inhaler  . ASTHMA 06/03/2009   Qualifier: Diagnosis of  By: Elease Hashimoto MD, Bruce    . BREAST IMPLANTS, BILATERAL, HX OF 10/01/2009   Qualifier: Diagnosis of  By: Elease Hashimoto MD, Bruce    . C. difficile diarrhea   . Chronic left hip pain 05/22/2014  . Complete tear of right rotator cuff 11/22/2017  . Current mild episode of major depressive disorder without prior episode ([place]) 04/01/2018  . Depression    At age of 63  . DEPRESSION 06/03/2009   Qualifier: Diagnosis of  By: Elease Hashimoto MD, Bruce    . 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 Dysrhythmia    tachycardia -controlled by med  . FH: colon polyps 12/30/2010  . Fibromyalgia Dx 2010  . Fibromyalgia syndrome 11/06/2010  . GERD (gastroesophageal reflux disease) Dx 2008  . HEADACHES, HX OF 06/30/2009   Qualifier: Diagnosis of  By: Elease Hashimoto MD, Bruce    . Healthcare maintenance 05/30/2014  . Hyperlipidemia Dx 2010  . Hypertension Dx 2010  . Infection of breast implant ([place]) [id number]   staph and fungemia. breat removed in 2012 in [place]   . Left-sided chest wall pain 05/29/2014  . Obesity (BMI 30.0-34.9) 05/22/2014  . Osteoarthritis of both hands 05/30/2014  . Pancreatitis   . Pneumonia   . Right maxillary sinusitis, chronic 08/27/2009   Qualifier: Diagnosis of  By: Valma Cava LPN, Izora Gala    . S/P complete hysterectomy 05/30/2014  . Spinal headache 1990   BP dropped after spinal anes.  . 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 Tailbone injury 1990   broken   . URINARY INCONTINENCE 06/03/2009   Qualifier: Diagnosis of   By: Elease Hashimoto MD, Bruce    . Urinary incontinence, stress     Family History  Problem Relation Age of Onset  . Coronary artery disease Mother 30  . Hypertension Mother   . Stroke Mother   . Heart disease Mother   . Congestive Heart Failure Mother   . Hypertension Father   . Diabetes Father        type 2  . Dementia Father   . Stroke Father        deceased, passed away had over 100 mini strokes  . Ovarian cancer Maternal Aunt 80  . Schizophrenia Sister   . Diabetes Paternal Uncle   . Diabetes Paternal Grandmother   . Heart disease Maternal Grandmother   . Prostate cancer Maternal Grandfather   . Bone cancer Maternal Grandfather   . Heart disease Maternal Aunt   . Kidney disease Maternal Aunt   . Heart disease Maternal Uncle   . Heart disease Paternal Uncle   . Healthy Son   . Other Daughter        neurocariogenic syndrome NCS Pots, NCAD, EDS, C677T  .  Colon polyps Sister   . Heart attack Nephew        age 40   Past Surgical History:  Procedure Laterality Date  . ABDOMINOPLASTY  1997  . ANAL FISSURE REPAIR  1985  . APPENDECTOMY  1987  . BREAST BIOPSY  2011  . BREAST ENHANCEMENT SURGERY  1997  . BREAST IMPLANT REMOVAL  6/12   implants and benign breast lumps removed  . CHOLECYSTECTOMY  1991  . LAPAROSCOPIC ASSISTED VAGINAL HYSTERECTOMY  12/20/2011   Bilateral Salpingo-Oophorectomy  . TONSILLECTOMY  1978   Social History   Social History Narrative  . Not on file   Immunization History  Administered Date(s) Administered  . Pneumococcal Polysaccharide-23 12/21/2011  . Tdap 10/09/2010     Objective: Vital Signs: BP 123/86 (BP Location: Left Arm, Patient Position: Sitting, Cuff Size: Normal)   Pulse 86   Resp 15   Ht 4\' 11"  (1.499 m)   Wt 169 lb (76.7 kg)   LMP 12/05/2011   BMI 34.13 kg/m    Physical Exam Vitals and nursing note reviewed.  Constitutional:      Appearance: She is well-developed.  HENT:     Head: Normocephalic and atraumatic.  Eyes:      Conjunctiva/sclera: Conjunctivae normal.  Cardiovascular:     Rate and Rhythm: Normal rate and regular rhythm.     Heart sounds: Normal heart sounds.  Pulmonary:     Effort: Pulmonary effort is normal.     Breath sounds: Normal breath sounds.  Abdominal:     General: Bowel sounds are normal.     Palpations: Abdomen is soft.  Musculoskeletal:     Cervical back: Normal range of motion.  Lymphadenopathy:     Cervical: No cervical adenopathy.  Skin:    General: Skin is warm and dry.     Capillary Refill: Capillary refill takes less than 2 seconds.  Neurological:     Mental Status: She is alert and oriented to person, place, and time.  Psychiatric:        Behavior: Behavior normal.      Musculoskeletal Exam: C-spine was in good range of motion.  She has discomfort range of motion of her lumbar spine.  She has limited range of motion of her right shoulder joint with abduction about 90 degrees.  Left shoulder joint was in full range of motion.  Elbow joints wrist joints MCPs PIPs DIPs been full range of motion.  She has thickening of some of her DIP joints without synovitis.  Hip joints, knee joints, ankles, MTPs and PIPs with good range of motion with no synovitis.  She had tenderness over right trochanteric bursa consistent with trochanteric bursitis.  CDAI Exam: CDAI Score: -- Patient Global: --; Provider Global: -- Swollen: --; Tender: -- Joint Exam 04/11/2019   No joint exam has been documented for this visit   There is currently no information documented on the homunculus. Go to the Rheumatology activity and complete the homunculus joint exam.  Investigation: No additional findings.  Imaging: No results found.  Recent Labs: Lab Results  Component Value Date   WBC 12.2 (H) 03/31/2018   HGB 13.5 03/31/2018   PLT 345 03/31/2018   NA 138 08/22/2018   K 4.1 08/22/2018   CL 102 08/22/2018   CO2 22 08/22/2018   GLUCOSE 122 (H) 08/22/2018   BUN 11 08/22/2018    CREATININE 0.70 08/22/2018   BILITOT <0.2 03/31/2018   ALKPHOS 82 03/31/2018   AST 15 03/31/2018  ALT 23 03/31/2018   PROT 6.7 03/31/2018   ALBUMIN 4.5 03/31/2018   CALCIUM 9.8 08/22/2018   GFRAA 110 08/22/2018    Speciality Comments: No specialty comments available.  Procedures:  No procedures performed Allergies: Sulfa antibiotics, Adhesive [tape], Latex, and Pineapple flavor   Assessment / Plan:     Visit Diagnoses: Pain in both hands -patient complains of pain and discomfort in the bilateral hands and also intermittent swelling.  On examination she had no synovitis.  Thickening of DIP joints was noted.  Plan: XR Hand 2 View Right, XR Hand 2 View Left, x-ray findings were consistent with osteoarthritis of bilateral hands.  Uric acid, Rheumatoid factor, Sedimentation rate, Cyclic citrul peptide antibody, IgG, ANA  Primary osteoarthritis of both hands-clinical and radiographic findings are consistent with osteoarthritis.  DDD (degenerative disc disease), lumbar-I reviewed MRI of her lumbar spine which showed mild degenerative changes.  Traumatic complete tear of right rotator cuff, sequela-patient has limited range of motion of her right shoulder joint.  Chronic left hip pain-she had good range of motion of her hip joint today.  Fibromyalgia -patient was diagnosed with fibromyalgia by rheumatologist at [facility] after extensive work-up in the past.  She continues to have generalized pain discomfort and positive tender points.  She also suffers from hyperalgesia.  Patient is on gabapentin, Lexapro and Flexeril.  Other medical problems are listed as follows:  History of gastroesophageal reflux (GERD)  History of pancreatitis  History of Clostridioides difficile infection  History of hyperlipidemia - Treated with Lipitor.  S/P complete hysterectomy  Orders: Orders Placed This Encounter  Procedures  . XR Hand 2 View Right  . XR Hand 2 View Left  . Uric acid   . Rheumatoid factor  . Sedimentation rate  . Cyclic citrul peptide antibody, IgG  . ANA   No orders of the defined types were placed in this encounter.    Follow-Up Instructions: Return for Polyarthralgia.   Bo Merino, MD  Note - This record has been created using Editor, commissioning.  Chart creation errors have been sought, but may not always  have been located. Such creation errors do not reflect on  the standard of medical care.

## 2019-04-11 ENCOUNTER — Other Ambulatory Visit: Payer: Self-pay

## 2019-04-11 ENCOUNTER — Ambulatory Visit (INDEPENDENT_AMBULATORY_CARE_PROVIDER_SITE_OTHER): Payer: 59 | Admitting: Rheumatology

## 2019-04-11 ENCOUNTER — Ambulatory Visit (INDEPENDENT_AMBULATORY_CARE_PROVIDER_SITE_OTHER): Payer: 59

## 2019-04-11 ENCOUNTER — Encounter: Payer: Self-pay | Admitting: Rheumatology

## 2019-04-11 ENCOUNTER — Ambulatory Visit: Payer: Self-pay

## 2019-04-11 VITALS — BP 123/86 | HR 86 | Resp 15 | Ht 59.0 in | Wt 169.0 lb

## 2019-04-11 DIAGNOSIS — M5136 Other intervertebral disc degeneration, lumbar region: Secondary | ICD-10-CM

## 2019-04-11 DIAGNOSIS — M25552 Pain in left hip: Secondary | ICD-10-CM

## 2019-04-11 DIAGNOSIS — Z90710 Acquired absence of both cervix and uterus: Secondary | ICD-10-CM

## 2019-04-11 DIAGNOSIS — M19042 Primary osteoarthritis, left hand: Secondary | ICD-10-CM

## 2019-04-11 DIAGNOSIS — M19041 Primary osteoarthritis, right hand: Secondary | ICD-10-CM | POA: Diagnosis not present

## 2019-04-11 DIAGNOSIS — S46011S Strain of muscle(s) and tendon(s) of the rotator cuff of right shoulder, sequela: Secondary | ICD-10-CM | POA: Diagnosis not present

## 2019-04-11 DIAGNOSIS — M79642 Pain in left hand: Secondary | ICD-10-CM | POA: Diagnosis not present

## 2019-04-11 DIAGNOSIS — M51369 Other intervertebral disc degeneration, lumbar region without mention of lumbar back pain or lower extremity pain: Secondary | ICD-10-CM

## 2019-04-11 DIAGNOSIS — Z8639 Personal history of other endocrine, nutritional and metabolic disease: Secondary | ICD-10-CM

## 2019-04-11 DIAGNOSIS — M79641 Pain in right hand: Secondary | ICD-10-CM | POA: Diagnosis not present

## 2019-04-11 DIAGNOSIS — G8929 Other chronic pain: Secondary | ICD-10-CM

## 2019-04-11 DIAGNOSIS — Z8719 Personal history of other diseases of the digestive system: Secondary | ICD-10-CM

## 2019-04-11 DIAGNOSIS — M797 Fibromyalgia: Secondary | ICD-10-CM

## 2019-04-11 DIAGNOSIS — Z8619 Personal history of other infectious and parasitic diseases: Secondary | ICD-10-CM

## 2019-04-13 ENCOUNTER — Encounter: Payer: Self-pay | Admitting: Rheumatology

## 2019-04-13 LAB — RHEUMATOID FACTOR: Rheumatoid fact SerPl-aCnc: <14 IU/mL (ref <14)

## 2019-04-13 LAB — URIC ACID: Uric Acid, Serum: 6.2 mg/dL (ref 2.5–7.0)

## 2019-04-13 LAB — SEDIMENTATION RATE: Sed Rate: 17 mm/h (ref 0–30)

## 2019-04-13 LAB — ANTI-NUCLEAR AB-TITER (ANA TITER): ANA Titer 1: 1:80 {titer} — ABNORMAL HIGH

## 2019-04-13 LAB — ANA: Anti Nuclear Antibody (ANA): POSITIVE — AB

## 2019-04-13 LAB — CYCLIC CITRUL PEPTIDE ANTIBODY, IGG: Cyclic Citrullin Peptide Ab: <16 UNITS

## 2019-04-13 NOTE — Progress Notes (Signed)
ANA is low titer which is not significant.  All other labs are within normal limits.  I will discuss results at the follow-up visit.

## 2019-04-13 NOTE — Telephone Encounter (Signed)
Rose Robertson advised patient of lab results. Dr. Estanislado Pandy will discuss in detail at follow up appointment.

## 2019-04-27 ENCOUNTER — Other Ambulatory Visit: Payer: Self-pay | Admitting: Physician Assistant

## 2019-04-27 ENCOUNTER — Other Ambulatory Visit: Payer: Self-pay | Admitting: Nurse Practitioner

## 2019-04-27 DIAGNOSIS — G8929 Other chronic pain: Secondary | ICD-10-CM

## 2019-04-29 NOTE — Progress Notes (Signed)
Office Visit Note  Patient: Rose Robertson             Date of Birth: [birth year]           MRN: [id number]             PCP: Gildardo Pounds, NP Referring: Gildardo Pounds, NP Visit Date: 05/09/2019 Occupation: _0 @  Subjective:  Pain in multiple joints.   History of Present Illness: Rose Robertson is a 59 y.o. female with history of joint pain and fibromyalgia.  She returns today after her initial visit.  She states she continues to have pain and stiffness in her bilateral hands.  The right hip has been feeling better since she has been getting some massages.  She continues to have some lower back pain.  She has difficulty climbing stairs and getting up from the chair due to her lower back.  She states she gets frequent nasal ulcers which are related to herpes infection in the past.  Activities of Daily Living:  Patient reports morning stiffness for 2 hours.   Patient Reports nocturnal pain.  Difficulty dressing/grooming: Denies Difficulty climbing stairs: Reports Difficulty getting out of chair: Reports Difficulty using hands for taps, buttons, cutlery, and/or writing: Reports  Review of Systems  Constitutional: Positive for fatigue. Negative for night sweats, weight gain and weight loss.  HENT: Positive for mouth dryness. Negative for mouth sores, trouble swallowing, trouble swallowing and nose dryness.        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 Negative for pain, redness and visual disturbance.  Respiratory: Negative for cough, shortness of breath and difficulty breathing.   Cardiovascular: Negative for chest pain, palpitations, hypertension, irregular heartbeat and swelling in legs/feet.  Gastrointestinal: Negative for blood in stool, constipation and diarrhea.  Endocrine: Negative for increased urination.  Genitourinary: Negative for vaginal dryness.  Musculoskeletal: Positive for arthralgias, joint pain and  morning stiffness. Negative for joint swelling, myalgias, muscle weakness, muscle tenderness and myalgias.  Skin: Negative for color change, rash, hair loss, skin tightness, ulcers and sensitivity to sunlight.  Allergic/Immunologic: Negative for susceptible to infections.  Neurological: Negative for dizziness, memory loss, night sweats and weakness.  Hematological: Negative for swollen glands.  Psychiatric/Behavioral: Positive for depressed mood and sleep disturbance. The patient is nervous/anxious.     PMFS History:  Patient Active Problem List   Diagnosis Date Noted  . Current mild episode of major depressive disorder without prior episode ([place]) 04/01/2018  . Complete tear of right rotator cuff 11/22/2017  . S/P complete hysterectomy 05/30/2014  . Healthcare maintenance 05/30/2014  . Osteoarthritis of both hands 05/30/2014  . Left-sided chest wall pain 05/29/2014  . Obesity (BMI 30.0-34.9) 05/22/2014  . Chronic left hip pain 05/22/2014  . Abdominal wall pain in left flank 05/21/2014  . FH: colon polyps 12/30/2010  . GERD (gastroesophageal reflux disease) 11/06/2010  . Fibromyalgia syndrome 11/06/2010  . 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 BREAST IMPLANTS, BILATERAL, HX OF 10/01/2009  . Right maxillary sinusitis, chronic 08/27/2009  . HEADACHES, HX OF 06/30/2009  . DEPRESSION 06/03/2009  . ASTHMA 06/03/2009  . URINARY INCONTINENCE 06/03/2009    Past Medical History:  Diagnosis Date  . Abdominal wall pain in left flank 05/21/2014  . Anal fissure   . Asthma Dx 1991   no inhaler  . ASTHMA 06/03/2009   Qualifier: Diagnosis of  By: Elease Hashimoto MD, Bruce    . BREAST IMPLANTS, BILATERAL, HX OF 10/01/2009  Qualifier: Diagnosis of  By: Elease Hashimoto MD, Bruce    . C. difficile diarrhea   . Chronic left hip pain 05/22/2014  . Complete tear of right rotator cuff 11/22/2017  . Current mild episode of major depressive disorder without prior episode ([name]) 04/01/2018  . Depression    At age of  56  . DEPRESSION 06/03/2009   Qualifier: Diagnosis of  By: Elease Hashimoto MD, Bruce    . 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 Dysrhythmia    tachycardia -controlled by med  . FH: colon polyps 12/30/2010  . Fibromyalgia Dx 2010  . Fibromyalgia syndrome 11/06/2010  . GERD (gastroesophageal reflux disease) Dx 2008  . HEADACHES, HX OF 06/30/2009   Qualifier: Diagnosis of  By: Elease Hashimoto MD, Bruce    . Healthcare maintenance 05/30/2014  . Hyperlipidemia Dx 2010  . Hypertension Dx 2010  . Infection of breast implant ([place]) [id number]   staph and fungemia. breat removed in 2012 in [place]   . Left-sided chest wall pain 05/29/2014  . Obesity (BMI 30.0-34.9) 05/22/2014  . Osteoarthritis of both hands 05/30/2014  . Pancreatitis   . Pneumonia   . Right maxillary sinusitis, chronic 08/27/2009   Qualifier: Diagnosis of  By: Valma Cava LPN, Izora Gala    . S/P complete hysterectomy 05/30/2014  . Spinal headache 1990   BP dropped after spinal anes.  . 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 Tailbone injury 1990   broken   . URINARY INCONTINENCE 06/03/2009   Qualifier: Diagnosis of  By: Elease Hashimoto MD, Bruce    . Urinary incontinence, stress     Family History  Problem Relation Age of Onset  . Coronary artery disease Mother 73  . Hypertension Mother   . Stroke Mother   . Heart disease Mother   . Congestive Heart Failure Mother   . Hypertension Father   . Diabetes Father        type 2  . Dementia Father   . Stroke Father        deceased, passed away had over 100 mini strokes  . Ovarian cancer Maternal Aunt 80  . Schizophrenia Sister   . Diabetes Paternal Uncle   . Diabetes Paternal Grandmother   . Heart disease Maternal Grandmother   . Prostate cancer Maternal Grandfather   . Bone cancer Maternal Grandfather   . Heart disease Maternal Aunt   . Kidney disease Maternal Aunt   . Heart disease Maternal Uncle   . Heart disease Paternal Uncle   . Healthy Son   . Other  Daughter        neurocariogenic syndrome NCS Pots, NCAD, EDS, C677T  . Colon polyps Sister   . Heart attack Nephew        age 65   Past Surgical History:  Procedure Laterality Date  . ABDOMINOPLASTY  1997  . ANAL FISSURE REPAIR  1985  . APPENDECTOMY  1987  . BREAST BIOPSY  2011  . BREAST ENHANCEMENT SURGERY  1997  . BREAST IMPLANT REMOVAL  6/12   implants and benign breast lumps removed  . CHOLECYSTECTOMY  1991  . LAPAROSCOPIC ASSISTED VAGINAL HYSTERECTOMY  12/20/2011   Bilateral Salpingo-Oophorectomy  . TONSILLECTOMY  1978   Social History   Social History Narrative  . Not on file   Immunization History  Administered Date(s) Administered  . Pneumococcal Polysaccharide-23 12/21/2011  . Tdap 10/09/2010     Objective: Vital Signs: BP 123/82 (BP Location: Left Arm, Patient Position:  Sitting, Cuff Size: Small)   Pulse 81   Resp 13   Ht 4' 11.75" (1.518 m)   Wt 169 lb (76.7 kg)   LMP 12/05/2011   BMI 33.28 kg/m    Physical Exam Vitals and nursing note reviewed.  Constitutional:      Appearance: She is well-developed.  HENT:     Head: Normocephalic and atraumatic.  Eyes:     Conjunctiva/sclera: Conjunctivae normal.  Cardiovascular:     Rate and Rhythm: Normal rate and regular rhythm.     Heart sounds: Normal heart sounds.  Pulmonary:     Effort: Pulmonary effort is normal.     Breath sounds: Normal breath sounds.  Abdominal:     General: Bowel sounds are normal.     Palpations: Abdomen is soft.  Musculoskeletal:     Cervical back: Normal range of motion.  Lymphadenopathy:     Cervical: No cervical adenopathy.  Skin:    General: Skin is warm and dry.     Capillary Refill: Capillary refill takes less than 2 seconds.  Neurological:     Mental Status: She is alert and oriented to person, place, and time.  Psychiatric:        Behavior: Behavior normal.      Musculoskeletal Exam: C-spine thoracic spine with good range of motion.  She is some discomfort  range of motion of her lumbar spine.  Right shoulder joint had painful range of motion.  Left shoulder joint was in good range of motion.  Elbow joints wrist joints were in good range of motion.  She has bilateral PIP and DIP thickening but no synovitis was noted.  She had good range of motion of her hip joints.  Knee joints and ankle joints in good range of motion..  Some generalized hyperalgesia.  CDAI Exam: CDAI Score: -- Patient Global: --; Provider Global: -- Swollen: --; Tender: -- Joint Exam 05/09/2019   No joint exam has been documented for this visit   There is currently no information documented on the homunculus. Go to the Rheumatology activity and complete the homunculus joint exam.  Investigation: No additional findings.  Imaging: XR Hand 2 View Left  Result Date: 04/11/2019 PIP, and DIP narrowing was noted.  No MCP, intercarpal, radiocarpal or CMC joint space narrowing was noted.  No erosive changes were noted. Impression: These findings are consistent with osteoarthritis of the hand.  XR Hand 2 View Right  Result Date: 04/11/2019 PIP, and DIP narrowing was noted.  No MCP, intercarpal, radiocarpal or CMC joint space narrowing was noted.  No erosive changes were noted. Impression: These findings are consistent with osteoarthritis of the hand.   Recent Labs: Lab Results  Component Value Date   WBC 12.2 (H) 03/31/2018   HGB 13.5 03/31/2018   PLT 345 03/31/2018   NA 138 08/22/2018   K 4.1 08/22/2018   CL 102 08/22/2018   CO2 22 08/22/2018   GLUCOSE 122 (H) 08/22/2018   BUN 11 08/22/2018   CREATININE 0.70 08/22/2018   BILITOT <0.2 03/31/2018   ALKPHOS 82 03/31/2018   AST 15 03/31/2018   ALT 23 03/31/2018   PROT 6.7 03/31/2018   ALBUMIN 4.5 03/31/2018   CALCIUM 9.8 08/22/2018   GFRAA 110 08/22/2018   April 11, 2019 ANA 1: 80 speckled, RF negative, anti-CCP negative, uric acid 6.2, ESR 17 Speciality Comments: No specialty comments available.  Procedures:   No procedures performed Allergies: Sulfa antibiotics, Adhesive [tape], Latex, and Pineapple flavor  Assessment / Plan:     Visit Diagnoses: Positive ANA (antinuclear antibody) - ANA1:80 NS patient has no clinical features of autoimmune disease.  I would not initiate any further work-up.  Patient states that she had evaluation at [facility] in the past which was negative.  Primary osteoarthritis of both hands-she has DIP and PIP thickening.  Detailed counseling osteoarthritis was provided.  Joint protection was discussed.  A handout was given.  A list of natural anti-inflammatories was given.  I discussed lab results obtained at the last visit.  DDD (degenerative disc disease), lumbar - mild.  She continues to have some lower back pain.  A handout on back exercises was given.  Traumatic complete tear of right rotator cuff, sequela-she continues to have limited range of motion and some discomfort.  Chronic left hip pain-she is going to massage therapy which has been helping.  Other medical problems are listed as follows:  Fibromyalgia  History of hyperlipidemia  History of pancreatitis  History of gastroesophageal reflux (GERD)  Orders: No orders of the defined types were placed in this encounter.  No orders of the defined types were placed in this encounter.     Follow-Up Instructions: Return in about 6 months (around 11/09/2019) for Osteoarthritis.   Bo Merino, MD  Note - This record has been created using Editor, commissioning.  Chart creation errors have been sought, but may not always  have been located. Such creation errors do not reflect on  the standard of medical care.

## 2019-05-09 ENCOUNTER — Other Ambulatory Visit: Payer: Self-pay

## 2019-05-09 ENCOUNTER — Encounter: Payer: Self-pay | Admitting: Rheumatology

## 2019-05-09 ENCOUNTER — Ambulatory Visit: Payer: 59 | Admitting: Rheumatology

## 2019-05-09 VITALS — BP 123/82 | HR 81 | Resp 13 | Ht 59.75 in | Wt 169.0 lb

## 2019-05-09 DIAGNOSIS — R768 Other specified abnormal immunological findings in serum: Secondary | ICD-10-CM

## 2019-05-09 DIAGNOSIS — M797 Fibromyalgia: Secondary | ICD-10-CM

## 2019-05-09 DIAGNOSIS — M25552 Pain in left hip: Secondary | ICD-10-CM

## 2019-05-09 DIAGNOSIS — G8929 Other chronic pain: Secondary | ICD-10-CM

## 2019-05-09 DIAGNOSIS — M5136 Other intervertebral disc degeneration, lumbar region: Secondary | ICD-10-CM

## 2019-05-09 DIAGNOSIS — S46011S Strain of muscle(s) and tendon(s) of the rotator cuff of right shoulder, sequela: Secondary | ICD-10-CM

## 2019-05-09 DIAGNOSIS — Z8639 Personal history of other endocrine, nutritional and metabolic disease: Secondary | ICD-10-CM

## 2019-05-09 DIAGNOSIS — M19041 Primary osteoarthritis, right hand: Secondary | ICD-10-CM | POA: Diagnosis not present

## 2019-05-09 DIAGNOSIS — M51369 Other intervertebral disc degeneration, lumbar region without mention of lumbar back pain or lower extremity pain: Secondary | ICD-10-CM

## 2019-05-09 DIAGNOSIS — Z8719 Personal history of other diseases of the digestive system: Secondary | ICD-10-CM

## 2019-05-09 DIAGNOSIS — M19042 Primary osteoarthritis, left hand: Secondary | ICD-10-CM

## 2019-05-09 NOTE — Patient Instructions (Addendum)
Back Exercises The following exercises strengthen the muscles that help to support the trunk and back. They also help to keep the lower back flexible. Doing these exercises can help to prevent back pain or lessen existing pain.  If you have back pain or discomfort, try doing these exercises 2-3 times each day or as told by your health care provider.  As your pain improves, do them once each day, but increase the number of times that you repeat the steps for each exercise (do more repetitions).  To prevent the recurrence of back pain, continue to do these exercises once each day or as told by your health care provider. Do exercises exactly as told by your health care provider and adjust them as directed. It is normal to feel mild stretching, pulling, tightness, or discomfort as you do these exercises, but you should stop right away if you feel sudden pain or your pain gets worse. Exercises Single knee to chest Repeat these steps 3-5 times for each leg: 1. Lie on your back on a firm bed or the floor with your legs extended. 2. Bring one knee to your chest. Your other leg should stay extended and in contact with the floor. 3. Hold your knee in place by grabbing your knee or thigh with both hands and hold. 4. Pull on your knee until you feel a gentle stretch in your lower back or buttocks. 5. Hold the stretch for 10-30 seconds. 6. Slowly release and straighten your leg. Pelvic tilt Repeat these steps 5-10 times: 1. Lie on your back on a firm bed or the floor with your legs extended. 2. Bend your knees so they are pointing toward the ceiling and your feet are flat on the floor. 3. Tighten your lower abdominal muscles to press your lower back against the floor. This motion will tilt your pelvis so your tailbone points up toward the ceiling instead of pointing to your feet or the floor. 4. With gentle tension and even breathing, hold this position for 5-10 seconds. Cat-cow Repeat these steps until  your lower back becomes more flexible: 1. Get into a hands-and-knees position on a firm surface. Keep your hands under your shoulders, and keep your knees under your hips. You may place padding under your knees for comfort. 2. Let your head hang down toward your chest. Contract your abdominal muscles and point your tailbone toward the floor so your lower back becomes rounded like the back of a cat. 3. Hold this position for 5 seconds. 4. Slowly lift your head, let your abdominal muscles relax and point your tailbone up toward the ceiling so your back forms a sagging arch like the back of a cow. 5. Hold this position for 5 seconds.  Press-ups Repeat these steps 5-10 times: 1. Lie on your abdomen (face-down) on the floor. 2. Place your palms near your head, about shoulder-width apart. 3. Keeping your back as relaxed as possible and keeping your hips on the floor, slowly straighten your arms to raise the top half of your body and lift your shoulders. Do not use your back muscles to raise your upper torso. You may adjust the placement of your hands to make yourself more comfortable. 4. Hold this position for 5 seconds while you keep your back relaxed. 5. Slowly return to lying flat on the floor.  Bridges Repeat these steps 10 times: 1. Lie on your back on a firm surface. 2. Bend your knees so they are pointing toward the ceiling and   your feet are flat on the floor. Your arms should be flat at your sides, next to your body. 3. Tighten your buttocks muscles and lift your buttocks off the floor until your waist is at almost the same height as your knees. You should feel the muscles working in your buttocks and the back of your thighs. If you do not feel these muscles, slide your feet 1-2 inches farther away from your buttocks. 4. Hold this position for 3-5 seconds. 5. Slowly lower your hips to the starting position, and allow your buttocks muscles to relax completely. If this exercise is too easy, try  doing it with your arms crossed over your chest. Abdominal crunches Repeat these steps 5-10 times: 1. Lie on your back on a firm bed or the floor with your legs extended. 2. Bend your knees so they are pointing toward the ceiling and your feet are flat on the floor. 3. Cross your arms over your chest. 4. Tip your chin slightly toward your chest without bending your neck. 5. Tighten your abdominal muscles and slowly raise your trunk (torso) high enough to lift your shoulder blades a tiny bit off the floor. Avoid raising your torso higher than that because it can put too much stress on your low back and does not help to strengthen your abdominal muscles. 6. Slowly return to your starting position. Back lifts Repeat these steps 5-10 times: 1. Lie on your abdomen (face-down) with your arms at your sides, and rest your forehead on the floor. 2. Tighten the muscles in your legs and your buttocks. 3. Slowly lift your chest off the floor while you keep your hips pressed to the floor. Keep the back of your head in line with the curve in your back. Your eyes should be looking at the floor. 4. Hold this position for 3-5 seconds. 5. Slowly return to your starting position. Contact a health care provider if:  Your back pain or discomfort gets much worse when you do an exercise.  Your worsening back pain or discomfort does not lessen within 2 hours after you exercise. If you have any of these problems, stop doing these exercises right away. Do not do them again unless your health care provider says that you can. Get help right away if:  You develop sudden, severe back pain. If this happens, stop doing the exercises right away. Do not do them again unless your health care provider says that you can. This information is not intended to replace advice given to you by your health care provider. Make sure you discuss any questions you have with your health care provider. Document Revised: 06/22/2018 Document  Reviewed: 11/17/2017 [employer] Patient Education  2020 [employer] Hand Exercises Hand exercises can be helpful for almost anyone. These exercises can strengthen the hands, improve flexibility and movement, and increase blood flow to the hands. These results can make work and daily tasks easier. Hand exercises can be especially helpful for people who have joint pain from arthritis or have nerve damage from overuse (carpal tunnel syndrome). These exercises can also help people who have injured a hand. Exercises Most of these hand exercises are gentle stretching and motion exercises. It is usually safe to do them often throughout the day. Warming up your hands before exercise may help to reduce stiffness. You can do this with gentle massage or by placing your hands in warm water for 10-15 minutes. It is normal to feel some stretching, pulling, tightness, or mild discomfort as   you begin new exercises. This will gradually improve. Stop an exercise right away if you feel sudden, severe pain or your pain gets worse. Ask your health care provider which exercises are best for you. Knuckle bend or "claw" fist 1. Stand or sit with your arm, hand, and all five fingers pointed straight up. Make sure to keep your wrist straight during the exercise. 2. Gently bend your fingers down toward your palm until the tips of your fingers are touching the top of your palm. Keep your big knuckle straight and just bend the small knuckles in your fingers. 3. Hold this position for __________ seconds. 4. Straighten (extend) your fingers back to the starting position. Repeat this exercise 5-10 times with each hand. Full finger fist 1. Stand or sit with your arm, hand, and all five fingers pointed straight up. Make sure to keep your wrist straight during the exercise. 2. Gently bend your fingers into your palm until the tips of your fingers are touching the middle of your palm. 3. Hold this position for __________  seconds. 4. Extend your fingers back to the starting position, stretching every joint fully. Repeat this exercise 5-10 times with each hand. Straight fist 1. Stand or sit with your arm, hand, and all five fingers pointed straight up. Make sure to keep your wrist straight during the exercise. 2. Gently bend your fingers at the big knuckle, where your fingers meet your hand, and the middle knuckle. Keep the knuckle at the tips of your fingers straight and try to touch the bottom of your palm. 3. Hold this position for __________ seconds. 4. Extend your fingers back to the starting position, stretching every joint fully. Repeat this exercise 5-10 times with each hand. Tabletop 1. Stand or sit with your arm, hand, and all five fingers pointed straight up. Make sure to keep your wrist straight during the exercise. 2. Gently bend your fingers at the big knuckle, where your fingers meet your hand, as far down as you can while keeping the small knuckles in your fingers straight. Think of forming a tabletop with your fingers. 3. Hold this position for __________ seconds. 4. Extend your fingers back to the starting position, stretching every joint fully. Repeat this exercise 5-10 times with each hand. Finger spread 1. Place your hand flat on a table with your palm facing down. Make sure your wrist stays straight as you do this exercise. 2. Spread your fingers and thumb apart from each other as far as you can until you feel a gentle stretch. Hold this position for __________ seconds. 3. Bring your fingers and thumb tight together again. Hold this position for __________ seconds. Repeat this exercise 5-10 times with each hand. Making circles 1. Stand or sit with your arm, hand, and all five fingers pointed straight up. Make sure to keep your wrist straight during the exercise. 2. Make a circle by touching the tip of your thumb to the tip of your index finger. 3. Hold for __________ seconds. Then open your  hand wide. 4. Repeat this motion with your thumb and each finger on your hand. Repeat this exercise 5-10 times with each hand. Thumb motion 1. Sit with your forearm resting on a table and your wrist straight. Your thumb should be facing up toward the ceiling. Keep your fingers relaxed as you move your thumb. 2. Lift your thumb up as high as you can toward the ceiling. Hold for __________ seconds. 3. Bend your thumb across your palm as   far as you can, reaching the tip of your thumb for the small finger (pinkie) side of your palm. Hold for __________ seconds. Repeat this exercise 5-10 times with each hand. Grip strengthening  1. Hold a stress ball or other soft ball in the middle of your hand. 2. Slowly increase the pressure, squeezing the ball as much as you can without causing pain. Think of bringing the tips of your fingers into the middle of your palm. All of your finger joints should bend when doing this exercise. 3. Hold your squeeze for __________ seconds, then relax. Repeat this exercise 5-10 times with each hand. Contact a health care provider if:  Your hand pain or discomfort gets much worse when you do an exercise.  Your hand pain or discomfort does not improve within 2 hours after you exercise. If you have any of these problems, stop doing these exercises right away. Do not do them again unless your health care provider says that you can. Get help right away if:  You develop sudden, severe hand pain or swelling. If this happens, stop doing these exercises right away. Do not do them again unless your health care provider says that you can. This information is not intended to replace advice given to you by your health care provider. Make sure you discuss any questions you have with your health care provider. Document Revised: 06/08/2018 Document Reviewed: 02/16/2018 [employer] Patient Education  2020 [employer]  

## 2019-06-25 ENCOUNTER — Other Ambulatory Visit: Payer: Self-pay | Admitting: Family Medicine

## 2019-06-25 ENCOUNTER — Other Ambulatory Visit: Payer: Self-pay | Admitting: Physician Assistant

## 2019-06-25 ENCOUNTER — Other Ambulatory Visit: Payer: Self-pay | Admitting: Nurse Practitioner

## 2019-06-25 DIAGNOSIS — G8929 Other chronic pain: Secondary | ICD-10-CM

## 2019-06-25 DIAGNOSIS — K219 Gastro-esophageal reflux disease without esophagitis: Secondary | ICD-10-CM

## 2019-06-29 ENCOUNTER — Other Ambulatory Visit: Payer: Self-pay | Admitting: Family Medicine

## 2019-06-29 ENCOUNTER — Other Ambulatory Visit: Payer: Self-pay

## 2019-06-29 ENCOUNTER — Encounter: Payer: Self-pay | Admitting: Family Medicine

## 2019-06-29 ENCOUNTER — Ambulatory Visit: Payer: 59 | Attending: Family Medicine | Admitting: Family Medicine

## 2019-06-29 VITALS — BP 118/64 | HR 77 | Temp 98.6°F | Ht 59.75 in | Wt 168.4 lb

## 2019-06-29 DIAGNOSIS — M5440 Lumbago with sciatica, unspecified side: Secondary | ICD-10-CM

## 2019-06-29 DIAGNOSIS — L309 Dermatitis, unspecified: Secondary | ICD-10-CM

## 2019-06-29 DIAGNOSIS — R739 Hyperglycemia, unspecified: Secondary | ICD-10-CM

## 2019-06-29 DIAGNOSIS — F419 Anxiety disorder, unspecified: Secondary | ICD-10-CM

## 2019-06-29 DIAGNOSIS — E782 Mixed hyperlipidemia: Secondary | ICD-10-CM

## 2019-06-29 DIAGNOSIS — R1024 Suprapubic pain: Secondary | ICD-10-CM

## 2019-06-29 DIAGNOSIS — N898 Other specified noninflammatory disorders of vagina: Secondary | ICD-10-CM | POA: Diagnosis not present

## 2019-06-29 DIAGNOSIS — I10 Essential (primary) hypertension: Secondary | ICD-10-CM

## 2019-06-29 DIAGNOSIS — E669 Obesity, unspecified: Secondary | ICD-10-CM

## 2019-06-29 DIAGNOSIS — R21 Rash and other nonspecific skin eruption: Secondary | ICD-10-CM | POA: Insufficient documentation

## 2019-06-29 DIAGNOSIS — R102 Pelvic and perineal pain: Secondary | ICD-10-CM

## 2019-06-29 DIAGNOSIS — M5441 Lumbago with sciatica, right side: Secondary | ICD-10-CM

## 2019-06-29 DIAGNOSIS — G8929 Other chronic pain: Secondary | ICD-10-CM

## 2019-06-29 DIAGNOSIS — F329 Major depressive disorder, single episode, unspecified: Secondary | ICD-10-CM

## 2019-06-29 DIAGNOSIS — N393 Stress incontinence (female) (male): Secondary | ICD-10-CM | POA: Diagnosis not present

## 2019-06-29 DIAGNOSIS — F32A Depression, unspecified: Secondary | ICD-10-CM

## 2019-06-29 DIAGNOSIS — E66811 Obesity, class 1: Secondary | ICD-10-CM

## 2019-06-29 DIAGNOSIS — K219 Gastro-esophageal reflux disease without esophagitis: Secondary | ICD-10-CM

## 2019-06-29 LAB — POCT URINALYSIS DIP (CLINITEK)
Bilirubin, UA: NEGATIVE
Blood, UA: NEGATIVE
Glucose, UA: NEGATIVE mg/dL
Ketones, POC UA: NEGATIVE mg/dL
Leukocytes, UA: NEGATIVE
Nitrite, UA: NEGATIVE
POC PROTEIN,UA: NEGATIVE
Spec Grav, UA: 1.02
Urobilinogen, UA: 0.2 U/dL
pH, UA: 7

## 2019-06-29 MED ORDER — PANTOPRAZOLE SODIUM 40 MG PO TBEC: 40.0000 mg | DELAYED_RELEASE_TABLET | Freq: Every day | ORAL | 0 refills | Status: DC

## 2019-06-29 MED ORDER — ESCITALOPRAM OXALATE 20 MG PO TABS: 20.0000 mg | ORAL_TABLET | Freq: Every day | ORAL | 1 refills | Status: DC

## 2019-06-29 MED ORDER — ATORVASTATIN CALCIUM 20 MG PO TABS: 20.0000 mg | ORAL_TABLET | Freq: Every day | ORAL | 2 refills | Status: DC

## 2019-06-29 MED ORDER — TIZANIDINE HCL 4 MG PO TABS: ORAL_TABLET | ORAL | 2 refills | Status: DC

## 2019-06-29 MED ORDER — TRIAMCINOLONE ACETONIDE 0.1 % EX CREA: 1.0000 "application " | TOPICAL_CREAM | Freq: Two times a day (BID) | CUTANEOUS | 0 refills | Status: AC

## 2019-06-29 MED ORDER — GABAPENTIN 300 MG PO CAPS: 300.0000 mg | ORAL_CAPSULE | Freq: Three times a day (TID) | ORAL | 0 refills | Status: DC

## 2019-06-29 MED ORDER — NEBIVOLOL HCL 5 MG PO TABS: ORAL_TABLET | ORAL | 2 refills | Status: DC

## 2019-06-29 MED ORDER — FLUCONAZOLE 150 MG PO TABS: 150.0000 mg | ORAL_TABLET | Freq: Once | ORAL | 1 refills | Status: AC

## 2019-06-29 MED ORDER — HYOSCYAMINE SULFATE SL 0.125 MG SL SUBL: SUBLINGUAL_TABLET | SUBLINGUAL | 1 refills | Status: DC

## 2019-06-29 NOTE — Patient Instructions (Signed)

## 2019-06-29 NOTE — Progress Notes (Signed)
Established Patient Office Visit  Subjective:  Patient ID: Rose Robertson, female    DOB: [birth year]  Age: 59 y.o. MRN: [id number]  CC: Medication refills, possible yeast infection  HPI Rose Robertson, 59 yo patient of Dr. Raul Del, who presents due to the need for medication refills.  She also has complaint at today's visit of recurrent issues with vaginal itching and she believes she might have a yeast infection.  She reports that she has had several years of recurrent vaginal itching and irritation.  She has not had any recent sexual activity in several years secondary to pain with last attempt at sexual intercourse and sensation of possible bulging within the vaginal canal.  She has had prior hysterectomy secondary to fibroids.  She does have some mild current lower abdominal discomfort which is mild.  Upon questioning, she does have issues with urinary leakage with coughing, sneezing and with onset of urge of not able to get to the restroom in time.  She denies increased thirst but tries to drink water throughout the day.          She needs refill of her medications for treatment of chronic issues including acid reflux, which is controlled with medication use and avoidance of trigger foods, refill of blood pressure medication which is kept her blood pressure controlled, her cholesterol medication, Lipitor and a cream that she uses for recurrent rash.  She also needs refills of medication to help with chronic back pain with radiation.  Past Medical History:  Diagnosis Date  . Abdominal wall pain in left flank 05/21/2014  . Anal fissure   . Asthma Dx 1991   no inhaler  . ASTHMA 06/03/2009   Qualifier: Diagnosis of  By: Elease Hashimoto MD, Bruce    . BREAST IMPLANTS, BILATERAL, HX OF 10/01/2009   Qualifier: Diagnosis of  By: Elease Hashimoto MD, Bruce    . C. difficile diarrhea   . Chronic left hip pain 05/22/2014  . Complete tear of right rotator cuff 11/22/2017  . Current  mild episode of major depressive disorder without prior episode ([place]) 04/01/2018  . Depression    At age of 69  . DEPRESSION 06/03/2009   Qualifier: Diagnosis of  By: Elease Hashimoto MD, Bruce    . 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 Dysrhythmia    tachycardia -controlled by med  . FH: colon polyps 12/30/2010  . Fibromyalgia Dx 2010  . Fibromyalgia syndrome 11/06/2010  . GERD (gastroesophageal reflux disease) Dx 2008  . HEADACHES, HX OF 06/30/2009   Qualifier: Diagnosis of  By: Elease Hashimoto MD, Bruce    . Healthcare maintenance 05/30/2014  . Hyperlipidemia Dx 2010  . Hypertension Dx 2010  . Infection of breast implant ([place]) [id number]   staph and fungemia. breat removed in 2012 in [place]   . Left-sided chest wall pain 05/29/2014  . Obesity (BMI 30.0-34.9) 05/22/2014  . Osteoarthritis of both hands 05/30/2014  . Pancreatitis   . Pneumonia   . Right maxillary sinusitis, chronic 08/27/2009   Qualifier: Diagnosis of  By: Valma Cava LPN, Izora Gala    . S/P complete hysterectomy 05/30/2014  . Spinal headache 1990   BP dropped after spinal anes.  . 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 Tailbone injury 1990   broken   . URINARY INCONTINENCE 06/03/2009   Qualifier: Diagnosis of  By: Elease Hashimoto MD, Bruce    . Urinary incontinence, stress     Past Surgical History:  Procedure Laterality Date  . ABDOMINOPLASTY  1997  . ANAL FISSURE REPAIR  1985  . APPENDECTOMY  1987  . BREAST BIOPSY  2011  . BREAST ENHANCEMENT SURGERY  1997  . BREAST IMPLANT REMOVAL  6/12   implants and benign breast lumps removed  . CHOLECYSTECTOMY  1991  . LAPAROSCOPIC ASSISTED VAGINAL HYSTERECTOMY  12/20/2011   Bilateral Salpingo-Oophorectomy  . TONSILLECTOMY  1978    Family History  Problem Relation Age of Onset  . Coronary artery disease Mother 69  . Hypertension Mother   . Stroke Mother   . Heart disease Mother   . Congestive Heart Failure Mother   . Hypertension Father   . Diabetes  Father        type 2  . Dementia Father   . Stroke Father        deceased, passed away had over 100 mini strokes  . Ovarian cancer Maternal Aunt 80  . Schizophrenia Sister   . Diabetes Paternal Uncle   . Diabetes Paternal Grandmother   . Heart disease Maternal Grandmother   . Prostate cancer Maternal Grandfather   . Bone cancer Maternal Grandfather   . Heart disease Maternal Aunt   . Kidney disease Maternal Aunt   . Heart disease Maternal Uncle   . Heart disease Paternal Uncle   . Healthy Son   . Other Daughter        neurocariogenic syndrome NCS Pots, NCAD, EDS, C677T  . Colon polyps Sister   . Heart attack Nephew        age 5    Social History   Socioeconomic History  . Marital status: Married    Spouse name: Not on file  . Number of children: 2  . Years of education: Not on file  . Highest education level: Not on file  Occupational History  . Occupation: unemployed  Tobacco Use  . Smoking status: Current Every Day Smoker    Packs/day: 0.10    Years: 34.00    Pack years: 3.40    Types: Cigarettes  . Smokeless tobacco: Never Used  Substance and Sexual Activity  . Alcohol use: Yes    Comment: occasionally  . Drug use: No  . Sexual activity: Yes    Birth control/protection: None    Comment: ? post menopausal  Other Topics Concern  . Not on file  Social History Narrative  . Not on file   Social Determinants of Health   Financial Resource Strain:   . Difficulty of Paying Living Expenses:   Food Insecurity:   . Worried About Charity fundraiser in the Last Year:   . Arboriculturist in the Last Year:   Transportation Needs:   . Film/video editor (Medical):   Marland Kitchen Lack of Transportation (Non-Medical):   Physical Activity:   . Days of Exercise per Week:   . Minutes of Exercise per Session:   Stress:   . Feeling of Stress :   Social Connections:   . Frequency of Communication with Friends and Family:   . Frequency of Social Gatherings with Friends and  Family:   . Attends Religious Services:   . Active Member of Clubs or Organizations:   . Attends Archivist Meetings:   Marland Kitchen Marital Status:   Intimate Partner Violence:   . Fear of Current or Ex-Partner:   . Emotionally Abused:   Marland Kitchen Physically Abused:   . Sexually Abused:     Outpatient Medications Prior to  Visit  Medication Sig Dispense Refill  . Ascorbic Acid (VITAMIN C PO) Take 1,000 mg by mouth daily.    . CHLOROPHYLL PO Take by mouth.    . cholecalciferol (VITAMIN D) 1000 UNITS tablet Take 5,000 Units by mouth daily.     . Cyanocobalamin (VITAMIN B-12 PO) Take by mouth.    . fish oil-omega-3 fatty acids 1000 MG capsule Take 1,200 mg by mouth daily.    Marland Kitchen atorvastatin (LIPITOR) 20 MG tablet TAKE 1 TABLET BY MOUTH DAILY. 30 tablet 2  . BYSTOLIC 5 MG tablet TAKE 1 TABLET (5 MG TOTAL) BY MOUTH DAILY. 90 tablet 2  . escitalopram (LEXAPRO) 20 MG tablet Take 1 tablet (20 mg total) by mouth daily. Must have office visit for refills 30 tablet 1  . gabapentin (NEURONTIN) 300 MG capsule TAKE 1 CAPSULE (300 MG TOTAL) BY MOUTH 3 (THREE) TIMES DAILY. 90 capsule 0  . Hyoscyamine Sulfate SL (LEVSIN/SL) 0.125 MG SUBL Take 1 tablet every 8 hours as needed 120 tablet 1  . pantoprazole (PROTONIX) 40 MG tablet Take 1 tablet (40 mg total) by mouth daily. Must have office visit for refills 30 tablet 0  . tiZANidine (ZANAFLEX) 4 MG tablet TAKE 1 TABLET (4 MG TOTAL) BY MOUTH 3 (THREE) TIMES DAILY. 60 tablet 2  . triamcinolone cream (KENALOG) 0.1 % Apply 1 application topically 2 (two) times daily. 30 g 0   No facility-administered medications prior to visit.    Allergies  Allergen Reactions  . Sulfa Antibiotics     Remo Lipps Johnson's syndrome  . Adhesive [Tape] Other (See Comments)    Blisters   . Latex     rash  . Pineapple Flavor     ROS Review of Systems  Constitutional: Positive for fatigue. Negative for chills and fever.  HENT: Negative for sore throat and trouble swallowing.     Respiratory: Negative for cough and shortness of breath.   Cardiovascular: Negative for chest pain and palpitations.  Gastrointestinal: Positive for abdominal pain. Negative for constipation, diarrhea and nausea.       Reflux with some foods and if not on medication  Endocrine: Negative for polydipsia, polyphagia and polyuria (Frequency).  Genitourinary: Positive for dyspareunia and frequency. Negative for vaginal pain (Vaginal discomfort).       Urinary leakage/gushing with coughing, sneezing, and inability to get to the restroom in time without leakage.  Vaginal itching and irritation  Musculoskeletal: Positive for arthralgias and back pain.  Skin: Positive for rash (Need to refill triamcinolone). Negative for wound.  Neurological: Positive for numbness. Negative for dizziness and headaches.  Hematological: Negative for adenopathy. Does not bruise/bleed easily.  Psychiatric/Behavioral: Negative for self-injury and suicidal ideas.      Objective:    Physical Exam  Constitutional: She is oriented to person, place, and time. She appears well-developed and well-nourished. No distress.  Neck: No JVD present.  Cardiovascular: Normal rate.  Pulmonary/Chest: Effort normal and breath sounds normal.  Abdominal: Soft. There is abdominal tenderness (mild suprapubic discomfort to palpation). There is no rebound and no guarding.  Genitourinary:    Genitourinary Comments: Declined   Musculoskeletal:        General: Tenderness present. No edema.     Comments: Lumbosacral tenderness to palpation and positive seated leg raise  Lymphadenopathy:    She has no cervical adenopathy.  Neurological: She is alert and oriented to person, place, and time.  Psychiatric: She has a normal mood and affect. Her behavior is normal.  Nursing  note and vitals reviewed.   BP 118/64   Pulse 77   Temp 98.6 F (37 C) (Temporal)   Ht 4' 11.75" (1.518 m)   Wt 168 lb 6.4 oz (76.4 kg)   LMP 12/05/2011   SpO2 98%    BMI 33.16 kg/m  Wt Readings from Last 3 Encounters:  06/29/19 168 lb 6.4 oz (76.4 kg)  05/09/19 169 lb (76.7 kg)  04/11/19 169 lb (76.7 kg)     Health Maintenance Due  Topic Date Due  . Hepatitis C Screening  Never done  . COVID-19 Vaccine (1) Never done  . MAMMOGRAM  06/30/2019     Lab Results  Component Value Date   TSH 1.38 06/30/2009   Lab Results  Component Value Date   WBC 12.2 (H) 03/31/2018   HGB 13.5 03/31/2018   HCT 38.3 03/31/2018   MCV 89 03/31/2018   PLT 345 03/31/2018   Lab Results  Component Value Date   NA 138 08/22/2018   K 4.1 08/22/2018   CO2 22 08/22/2018   GLUCOSE 122 (H) 08/22/2018   BUN 11 08/22/2018   CREATININE 0.70 08/22/2018   BILITOT <0.2 03/31/2018   ALKPHOS 82 03/31/2018   AST 15 03/31/2018   ALT 23 03/31/2018   PROT 6.7 03/31/2018   ALBUMIN 4.5 03/31/2018   CALCIUM 9.8 08/22/2018   GFR 88.10 10/01/2010   Lab Results  Component Value Date   CHOL 177 03/31/2018   Lab Results  Component Value Date   HDL 51 03/31/2018   Lab Results  Component Value Date   LDLCALC 75 03/31/2018   Lab Results  Component Value Date   TRIG 254 (H) 03/31/2018   Lab Results  Component Value Date   CHOLHDL 3.5 03/31/2018   Lab Results  Component Value Date   HGBA1C 5.5 03/28/2017      Assessment & Plan:  1. Vaginal itching; elevated glucose Patient with complaint of vaginal itching which she states is similar to prior yeast infections.  She also reports urinary incontinence which could be contributing to vaginal irritation.  She has had elevated glucose on prior blood work with glucose of 122 on 11/07/2019 visit with rheumatology therefore prediabetes/diabetes may also be playing a role in her recurrent issues with vaginal itching/possible yeast infections.  Plan was to perform urine cervicovaginal ancillary testing however courier had already picked up cytology samples for the day by the time patient completed urine sample.  She will be  treated presumptively with Diflucan for possible yeast infection.  Gynecology referral to evaluate for possible bladder prolapse contributing to urinary incontinence versus pelvic floor weakness or atrophic vaginitis as contributing factors to her recurrent issues of vaginal itching.  Lab services were unavailable at the end of patient's visit but she should consider return for hemoglobin A1c in follow-up of past elevated blood sugars. - fluconazole (DIFLUCAN) 150 MG tablet; Take 1 tablet (150 mg total) by mouth once for 1 dose. To treat yeast infection/vaginal itching  Dispense: 1 tablet; Refill: 1 - Ambulatory referral to Gynecology  2. Stress incontinence of urine Urinalysis done at today's visit to look for possible urinary tract infection.  Urine will also be sent for culture as patient with suprapubic discomfort.  Gynecology/urogynecology referral and follow-up of stress and urgency urinary incontinence- - Urine Culture - Ambulatory referral to Gynecology - POCT URINALYSIS DIP (CLINITEK)  3. Suprapubic abdominal pain Urinalysis and urine culture and follow-up of suprapubic abdominal pain but patient also with history of IBS  which may be contributing to her abdominal discomfort and refill provided of Levsin. - Urine Culture - Hyoscyamine Sulfate SL (LEVSIN/SL) 0.125 MG SUBL; Take 1 tablet every 8 hours as needed  Dispense: 120 tablet; Refill: 1 - POCT URINALYSIS DIP (CLINITEK) -Hemoglobin A1c, Future  4. Essential hypertension Blood pressure currently controlled a refill provided of Bystolic. - nebivolol (BYSTOLIC) 5 MG tablet; TAKE 1 TABLET (5 MG TOTAL) BY MOUTH DAILY.  Dispense: 90 tablet; Refill: 2  5. Chronic bilateral low back pain with sciatica, sciatica laterality unspecified Refill provided of Zanaflex and gabapentin for continued treatment of chronic low back pain with radiation - tiZANidine (ZANAFLEX) 4 MG tablet; Twice daily as needed for muscle spasm  Dispense: 60 tablet;  Refill: 2 - gabapentin (NEURONTIN) 300 MG capsule; Take 1 capsule (300 mg total) by mouth 3 (three) times daily.  Dispense: 90 capsule; Refill: 0  6. Anxiety and depression She reports her symptoms are controlled and stable with current Lexapro 20 mg and refill provided. - escitalopram (LEXAPRO) 20 MG tablet; Take 1 tablet (20 mg total) by mouth daily. Dispense: 30 tablet; Refill: 1  7. Gastroesophageal reflux disease, unspecified whether esophagitis present Pantoprazole refill, avoid known trigger foods and avoidance of late night eating - pantoprazole (PROTONIX) 40 MG tablet; Take 1 tablet (40 mg total) by mouth daily. Must have office visit for refills  Dispense: 30 tablet; Refill: 0  8. Dermatitis Refill provided of triamcinolone per patient's request due to recurrent dermatitis - triamcinolone cream (KENALOG) 0.1 %; Apply 1 application topically 2 (two) times daily.  Dispense: 30 g; Refill: 0  9. Mixed hyperlipidemia Continue low-fat diet and refill provided of atorvastatin - atorvastatin (LIPITOR) 20 MG tablet; Take 1 tablet (20 mg total) by mouth daily. After evening meal  Dispense: 30 tablet; Refill: 2   Meds ordered this encounter  Medications  . fluconazole (DIFLUCAN) 150 MG tablet    Sig: Take 1 tablet (150 mg total) by mouth once for 1 dose. To treat yeast infection/vaginal itching    Dispense:  1 tablet    Refill:  1  . triamcinolone cream (KENALOG) 0.1 %    Sig: Apply 1 application topically 2 (two) times daily.    Dispense:  30 g    Refill:  0  . tiZANidine (ZANAFLEX) 4 MG tablet    Sig: Twice daily as needed for muscle spasm    Dispense:  60 tablet    Refill:  2  . pantoprazole (PROTONIX) 40 MG tablet    Sig: Take 1 tablet (40 mg total) by mouth daily. Must have office visit for refills    Dispense:  30 tablet    Refill:  0    Must have office visit for refills  . Hyoscyamine Sulfate SL (LEVSIN/SL) 0.125 MG SUBL    Sig: Take 1 tablet every 8 hours as needed     Dispense:  120 tablet    Refill:  1  . gabapentin (NEURONTIN) 300 MG capsule    Sig: Take 1 capsule (300 mg total) by mouth 3 (three) times daily.    Dispense:  90 capsule    Refill:  0  . escitalopram (LEXAPRO) 20 MG tablet    Sig: Take 1 tablet (20 mg total) by mouth daily. Must have office visit for refills    Dispense:  30 tablet    Refill:  1    Must have office visit for refills  . nebivolol (BYSTOLIC) 5 MG tablet  Sig: TAKE 1 TABLET (5 MG TOTAL) BY MOUTH DAILY.    Dispense:  90 tablet    Refill:  2  . atorvastatin (LIPITOR) 20 MG tablet    Sig: Take 1 tablet (20 mg total) by mouth daily. After evening meal    Dispense:  30 tablet    Refill:  2    An After Visit Summary was printed and given to the patient.   Follow-up: Return in about 3 months (around 09/28/2019) for consider lab visit; as needed.   Greater than 35 minutes spent in face-to-face time with the patient with additional 12 or more minutes spent on review of chart for prior specialty visits, office visits with PCP and labs as well as completion of today's note.  Antony Blackbird, MD

## 2019-06-29 NOTE — Progress Notes (Signed)
Med refills  Possible yeast infections  When inserting the monostat, per pt her vagina was hurting really back but is not hurting right now

## 2019-07-01 LAB — URINE CULTURE: Organism ID, Bacteria: NO GROWTH

## 2019-08-13 ENCOUNTER — Other Ambulatory Visit: Payer: Self-pay | Admitting: Family Medicine

## 2019-08-13 DIAGNOSIS — G8929 Other chronic pain: Secondary | ICD-10-CM

## 2019-08-13 DIAGNOSIS — M5440 Lumbago with sciatica, unspecified side: Secondary | ICD-10-CM

## 2019-08-28 ENCOUNTER — Other Ambulatory Visit: Payer: Self-pay | Admitting: Family Medicine

## 2019-08-28 DIAGNOSIS — F419 Anxiety disorder, unspecified: Secondary | ICD-10-CM

## 2019-08-28 DIAGNOSIS — F32A Depression, unspecified: Secondary | ICD-10-CM

## 2019-08-30 ENCOUNTER — Other Ambulatory Visit: Payer: Self-pay | Admitting: Nurse Practitioner

## 2019-08-30 ENCOUNTER — Telehealth: Payer: Self-pay

## 2019-08-30 DIAGNOSIS — F419 Anxiety disorder, unspecified: Secondary | ICD-10-CM

## 2019-08-30 DIAGNOSIS — F32A Depression, unspecified: Secondary | ICD-10-CM

## 2019-08-30 MED ORDER — ESCITALOPRAM OXALATE 20 MG PO TABS: 20.0000 mg | ORAL_TABLET | Freq: Every day | ORAL | 1 refills | Status: DC

## 2019-08-30 NOTE — Telephone Encounter (Signed)
Will route to PCP for Lexapro refill.

## 2019-08-30 NOTE — Telephone Encounter (Addendum)
Pt requesting refill. Pt requesting to get it today & is willing to wait in lobby for [facility]

## 2019-08-30 NOTE — Telephone Encounter (Signed)
Pt states has only one pill for tonight. Pt request if some other dr can prescribe. Call pt [phone]

## 2019-08-30 NOTE — Telephone Encounter (Signed)
Prescription has been sent.

## 2019-09-04 NOTE — Telephone Encounter (Signed)
Pt. Was informed and picked up her Rx.

## 2019-09-05 ENCOUNTER — Ambulatory Visit: Payer: Self-pay

## 2019-09-05 NOTE — Telephone Encounter (Signed)
Pt. Reports her Gabapentin was changed from 300 mg twice a day  To 600 mg 1/2 twice a day. She inadvertently took 600 mg twice a day and developed vertigo. Asking if 300 mg tablet could be sent to her pharmacy. Please advise pt.  Answer Assessment - Initial Assessment Questions 1. DESCRIPTION: "Describe your dizziness."     Vertigo 2. VERTIGO: "Do you feel like either you or the room is spinning or tilting?"      Yes 3. LIGHTHEADED: "Do you feel lightheaded?" (e.g., somewhat faint, woozy, weak upon standing)     Yes 4. SEVERITY: "How bad is it?"  "Can you walk?"   - MILD: Feels unsteady but walking normally.   - MODERATE: Feels very unsteady when walking, but not falling; interferes with normal activities (e.g., school, work) .   - SEVERE: Unable to walk without falling, or requires assistance to walk without falling.     Severe 5. ONSET:  "When did the dizziness begin?"     Started 08/14/19 6. AGGRAVATING FACTORS: "Does anything make it worse?" (e.g., standing, change in head position)     No 7. CAUSE: "What do you think is causing the dizziness?"     Gabapentin 8. RECURRENT SYMPTOM: "Have you had dizziness before?" If Yes, ask: "When was the last time?" "What happened that time?"     No 9. OTHER SYMPTOMS: "Do you have any other symptoms?" (e.g., headache, weakness, numbness, vomiting, earache)     No 10. PREGNANCY: "Is there any chance you are pregnant?" "When was your last menstrual period?"       No  Protocols used: DIZZINESS - VERTIGO-A-AH

## 2019-09-05 NOTE — Telephone Encounter (Signed)
Forwarded to [facility] to fu as appropriate.

## 2019-09-12 ENCOUNTER — Other Ambulatory Visit: Payer: Self-pay | Admitting: Nurse Practitioner

## 2019-09-12 ENCOUNTER — Telehealth: Payer: Self-pay | Admitting: Nurse Practitioner

## 2019-09-12 ENCOUNTER — Encounter: Payer: 59 | Admitting: Obstetrics and Gynecology

## 2019-09-12 NOTE — Telephone Encounter (Signed)
Pt need a refill foe 300MG   gabapentin (NEURONTIN) 600 MG tablet [[id number]]  [place] [place] [address]  [place] [address]  Phone: [phone] Fax: [phone]

## 2019-09-12 NOTE — Telephone Encounter (Signed)
Copied from [facility] [id number]. Topic: General - Other >> Sep 12, 2019 10:20 AM Hinda Lenis D wrote: Reason for CRM: PT asking for 300MG  / gabapentin (NEURONTIN) 600 MG tablet [[id number]] / please advise

## 2019-09-12 NOTE — Addendum Note (Signed)
Addended by: Matilde Sprang on: 09/12/2019 10:56 AM   Modules accepted: Orders

## 2019-09-13 NOTE — Telephone Encounter (Signed)
Rose Robertson -   Pt reports that she has been feeling sick since taking 1/2 of the 600 mg tablet. She is requesting the 300 mg capsule. If this change is appropriate, can you send it to our pharmacy?

## 2019-09-13 NOTE — Telephone Encounter (Signed)
Patient is calling to check the status of her medication request for gabapentin (NEURONTIN) 600 MG tablet.  She stated that she normally was  Taking 300MG  and now since taking the 600 MG she was feeling sick and only has 1/2 pill left.  Please advise and call patient to discuss at [phone]

## 2019-09-14 ENCOUNTER — Encounter: Payer: Self-pay | Admitting: Nurse Practitioner

## 2019-09-14 NOTE — Telephone Encounter (Signed)
Gabapentin was sent 08-14-2019.

## 2019-09-14 NOTE — Telephone Encounter (Signed)
Patient returned call to office with concerns regarding current prescription for Gabapentin tablet.Patient has picked up prescription for Gabapentin 600 mg tablets to be halved and taken three times a day to avoid going without the prescription during the weekend. Patient states that she has been taking the 1/2 tablet as prescribed but even when taking with food it makes her sick and experiences cramping. Patient states since taking the 1/2 tablet she feels drunk and is not able to drive.  Patient was previously prescribed Gabapentin 300 mg capsules. Patient states that when she was taking the capsule to did not feel "loopy" and was able to drive.  Patient asking if prescription can be changed back to the 300 capsule instead of taking 1/2 of the 600 mg tablet.

## 2019-09-14 NOTE — Telephone Encounter (Signed)
Patient checking on the status of gabapentin (NEURONTIN) and worried about going into the weekend with no pills, patient would like a follow up call today.    [place], [place] [address] Phone:  [phone]  Fax:  [phone]

## 2019-09-14 NOTE — Telephone Encounter (Signed)
This encounter was created in error - please disregard.

## 2019-09-17 NOTE — Telephone Encounter (Signed)
Will route to PCP 

## 2019-09-20 ENCOUNTER — Other Ambulatory Visit: Payer: Self-pay | Admitting: Nurse Practitioner

## 2019-09-20 MED ORDER — GABAPENTIN 300 MG PO CAPS
300.0000 mg | ORAL_CAPSULE | Freq: Three times a day (TID) | ORAL | 3 refills | Status: DC
Start: 2019-09-20 — End: 2020-04-30

## 2019-09-20 NOTE — Telephone Encounter (Signed)
It was changed due to her insurance. Please let her know I have switched it back however it will likely not be covered by her insurance. thanks

## 2019-09-21 NOTE — Telephone Encounter (Signed)
Attempt to reach patient to inform on PCP respond. No answer and LVM.

## 2019-09-26 ENCOUNTER — Other Ambulatory Visit: Payer: Self-pay | Admitting: Family Medicine

## 2019-09-26 DIAGNOSIS — K219 Gastro-esophageal reflux disease without esophagitis: Secondary | ICD-10-CM

## 2019-10-25 NOTE — Progress Notes (Deleted)
Office Visit Note  Patient: Rose Robertson             Date of Birth: [birth year]           MRN: [id number]             PCP: Gildardo Pounds, NP Referring: Gildardo Pounds, NP Visit Date: 11/07/2019 Occupation: @GUAROCC @  Subjective:  No chief complaint on file.   History of Present Illness: Rose Robertson is a 60 y.o. female ***   Activities of Daily Living:  Patient reports morning stiffness for *** {minute/hour:19697}.   Patient {ACTIONS;DENIES/REPORTS:21021675::"Denies"} nocturnal pain.  Difficulty dressing/grooming: {ACTIONS;DENIES/REPORTS:21021675::"Denies"} Difficulty climbing stairs: {ACTIONS;DENIES/REPORTS:21021675::"Denies"} Difficulty getting out of chair: {ACTIONS;DENIES/REPORTS:21021675::"Denies"} Difficulty using hands for taps, buttons, cutlery, and/or writing: {ACTIONS;DENIES/REPORTS:21021675::"Denies"}  No Rheumatology ROS completed.   PMFS History:  Patient Active Problem List   Diagnosis Date Noted  . Rash and nonspecific skin eruption 06/29/2019  . Current mild episode of major depressive disorder without prior episode ([place]) 04/01/2018  . Complete tear of right rotator cuff 11/22/2017  . S/P complete hysterectomy 05/30/2014  . Healthcare maintenance 05/30/2014  . Osteoarthritis of both hands 05/30/2014  . Left-sided chest wall pain 05/29/2014  . Obesity (BMI 30.0-34.9) 05/22/2014  . Chronic left hip pain 05/22/2014  . Abdominal wall pain in left flank 05/21/2014  . FH: colon polyps 12/30/2010  . GERD (gastroesophageal reflux disease) 11/06/2010  . Fibromyalgia syndrome 11/06/2010  . 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 BREAST IMPLANTS, BILATERAL, HX OF 10/01/2009  . Right maxillary sinusitis, chronic 08/27/2009  . HEADACHES, HX OF 06/30/2009  . DEPRESSION 06/03/2009  . ASTHMA 06/03/2009  . URINARY INCONTINENCE 06/03/2009    Past Medical History:  Diagnosis Date  . Abdominal wall pain in left flank  05/21/2014  . Anal fissure   . Asthma Dx 1991   no inhaler  . ASTHMA 06/03/2009   Qualifier: Diagnosis of  By: Elease Hashimoto MD, Bruce    . BREAST IMPLANTS, BILATERAL, HX OF 10/01/2009   Qualifier: Diagnosis of  By: Elease Hashimoto MD, Bruce    . C. difficile diarrhea   . Chronic left hip pain 05/22/2014  . Complete tear of right rotator cuff 11/22/2017  . Current mild episode of major depressive disorder without prior episode ([place]) 04/01/2018  . Depression    At age of 66  . DEPRESSION 06/03/2009   Qualifier: Diagnosis of  By: Elease Hashimoto MD, Bruce    . 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 Dysrhythmia    tachycardia -controlled by med  . FH: colon polyps 12/30/2010  . Fibromyalgia Dx 2010  . Fibromyalgia syndrome 11/06/2010  . GERD (gastroesophageal reflux disease) Dx 2008  . HEADACHES, HX OF 06/30/2009   Qualifier: Diagnosis of  By: Elease Hashimoto MD, Bruce    . Healthcare maintenance 05/30/2014  . Hyperlipidemia Dx 2010  . Hypertension Dx 2010  . Infection of breast implant ([place]) [id number]   staph and fungemia. breat removed in 2012 in [place]   . Left-sided chest wall pain 05/29/2014  . Obesity (BMI 30.0-34.9) 05/22/2014  . Osteoarthritis of both hands 05/30/2014  . Pancreatitis   . Pneumonia   . Right maxillary sinusitis, chronic 08/27/2009   Qualifier: Diagnosis of  By: Valma Cava LPN, Izora Gala    . S/P complete hysterectomy 05/30/2014  . Spinal headache 1990   BP dropped after spinal anes.  . 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 Tailbone injury 1990   broken   .  URINARY INCONTINENCE 06/03/2009   Qualifier: Diagnosis of  By: Elease Hashimoto MD, Bruce    . Urinary incontinence, stress     Family History  Problem Relation Age of Onset  . Coronary artery disease Mother 24  . Hypertension Mother   . Stroke Mother   . Heart disease Mother   . Congestive Heart Failure Mother   . Hypertension Father   . Diabetes Father        type 2  . Dementia Father   . Stroke  Father        deceased, passed away had over 100 mini strokes  . Ovarian cancer Maternal Aunt 80  . Schizophrenia Sister   . Diabetes Paternal Uncle   . Diabetes Paternal Grandmother   . Heart disease Maternal Grandmother   . Prostate cancer Maternal Grandfather   . Bone cancer Maternal Grandfather   . Heart disease Maternal Aunt   . Kidney disease Maternal Aunt   . Heart disease Maternal Uncle   . Heart disease Paternal Uncle   . Healthy Son   . Other Daughter        neurocariogenic syndrome NCS Pots, NCAD, EDS, C677T  . Colon polyps Sister   . Heart attack Nephew        age 70   Past Surgical History:  Procedure Laterality Date  . ABDOMINOPLASTY  1997  . ANAL FISSURE REPAIR  1985  . APPENDECTOMY  1987  . BREAST BIOPSY  2011  . BREAST ENHANCEMENT SURGERY  1997  . BREAST IMPLANT REMOVAL  6/12   implants and benign breast lumps removed  . CHOLECYSTECTOMY  1991  . LAPAROSCOPIC ASSISTED VAGINAL HYSTERECTOMY  12/20/2011   Bilateral Salpingo-Oophorectomy  . TONSILLECTOMY  1978   Social History   Social History Narrative  . Not on file   Immunization History  Administered Date(s) Administered  . Pneumococcal Polysaccharide-23 12/21/2011  . Tdap 10/09/2010     Objective: Vital Signs: LMP 12/05/2011    Physical Exam   Musculoskeletal Exam: ***  CDAI Exam: CDAI Score: -- Patient Global: --; Provider Global: -- Swollen: --; Tender: -- Joint Exam 11/07/2019   No joint exam has been documented for this visit   There is currently no information documented on the homunculus. Go to the Rheumatology activity and complete the homunculus joint exam.  Investigation: No additional findings.  Imaging: No results found.  Recent Labs: Lab Results  Component Value Date   WBC 12.2 (H) 03/31/2018   HGB 13.5 03/31/2018   PLT 345 03/31/2018   NA 138 08/22/2018   K 4.1 08/22/2018   CL 102 08/22/2018   CO2 22 08/22/2018   GLUCOSE 122 (H) 08/22/2018   BUN 11 08/22/2018    CREATININE 0.70 08/22/2018   BILITOT <0.2 03/31/2018   ALKPHOS 82 03/31/2018   AST 15 03/31/2018   ALT 23 03/31/2018   PROT 6.7 03/31/2018   ALBUMIN 4.5 03/31/2018   CALCIUM 9.8 08/22/2018   GFRAA 110 08/22/2018    Speciality Comments: No specialty comments available.  Procedures:  No procedures performed Allergies: Sulfa antibiotics, Adhesive [tape], Latex, and Pineapple flavor   Assessment / Plan:     Visit Diagnoses: No diagnosis found.  Orders: No orders of the defined types were placed in this encounter.  No orders of the defined types were placed in this encounter.   Face-to-face time spent with patient was *** minutes. Greater than 50% of time was spent in counseling and coordination of care.  Follow-Up Instructions:  No follow-ups on file.   Earnestine Mealing, [name]  Note - This record has been created using Editor, commissioning.  Chart creation errors have been sought, but may not always  have been located. Such creation errors do not reflect on  the standard of medical care.

## 2019-10-29 ENCOUNTER — Encounter: Payer: 59 | Admitting: Family Medicine

## 2019-10-29 ENCOUNTER — Other Ambulatory Visit: Payer: Self-pay | Admitting: Family Medicine

## 2019-10-29 DIAGNOSIS — E782 Mixed hyperlipidemia: Secondary | ICD-10-CM

## 2019-11-07 ENCOUNTER — Ambulatory Visit: Payer: 59 | Admitting: Rheumatology

## 2019-12-03 NOTE — Progress Notes (Deleted)
Office Visit Note  Patient: Rose Robertson             Date of Birth: 12/25/1960           MRN: [id number]             PCP: Gildardo Pounds, NP Referring: Gildardo Pounds, NP Visit Date: 12/13/2019 Occupation: @GUAROCC @  Subjective:  No chief complaint on file.   History of Present Illness: Rose Robertson is a 59 y.o. female ***   Activities of Daily Living:  Patient reports morning stiffness for *** {minute/hour:19697}.   Patient {ACTIONS;DENIES/REPORTS:21021675::"Denies"} nocturnal pain.  Difficulty dressing/grooming: {ACTIONS;DENIES/REPORTS:21021675::"Denies"} Difficulty climbing stairs: {ACTIONS;DENIES/REPORTS:21021675::"Denies"} Difficulty getting out of chair: {ACTIONS;DENIES/REPORTS:21021675::"Denies"} Difficulty using hands for taps, buttons, cutlery, and/or writing: {ACTIONS;DENIES/REPORTS:21021675::"Denies"}  No Rheumatology ROS completed.   PMFS History:  Patient Active Problem List   Diagnosis Date Noted  . Rash and nonspecific skin eruption 06/29/2019  . Current mild episode of major depressive disorder without prior episode ([place]) 04/01/2018  . Complete tear of right rotator cuff 11/22/2017  . S/P complete hysterectomy 05/30/2014  . Healthcare maintenance 05/30/2014  . Osteoarthritis of both hands 05/30/2014  . Left-sided chest wall pain 05/29/2014  . Obesity (BMI 30.0-34.9) 05/22/2014  . Chronic left hip pain 05/22/2014  . Abdominal wall pain in left flank 05/21/2014  . FH: colon polyps 12/30/2010  . GERD (gastroesophageal reflux disease) 11/06/2010  . Fibromyalgia syndrome 11/06/2010  . 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 BREAST IMPLANTS, BILATERAL, HX OF 10/01/2009  . Right maxillary sinusitis, chronic 08/27/2009  . HEADACHES, HX OF 06/30/2009  . DEPRESSION 06/03/2009  . ASTHMA 06/03/2009  . URINARY INCONTINENCE 06/03/2009    Past Medical History:  Diagnosis Date  . Abdominal wall pain in left flank  05/21/2014  . Anal fissure   . Asthma Dx 1991   no inhaler  . ASTHMA 06/03/2009   Qualifier: Diagnosis of  By: Elease Hashimoto MD, Bruce    . BREAST IMPLANTS, BILATERAL, HX OF 10/01/2009   Qualifier: Diagnosis of  By: Elease Hashimoto MD, Bruce    . C. difficile diarrhea   . Chronic left hip pain 05/22/2014  . Complete tear of right rotator cuff 11/22/2017  . Current mild episode of major depressive disorder without prior episode ([place]) 04/01/2018  . Depression    At age of 22  . DEPRESSION 06/03/2009   Qualifier: Diagnosis of  By: Elease Hashimoto MD, Bruce    . 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 Dysrhythmia    tachycardia -controlled by med  . FH: colon polyps 12/30/2010  . Fibromyalgia Dx 2010  . Fibromyalgia syndrome 11/06/2010  . GERD (gastroesophageal reflux disease) Dx 2008  . HEADACHES, HX OF 06/30/2009   Qualifier: Diagnosis of  By: Elease Hashimoto MD, Bruce    . Healthcare maintenance 05/30/2014  . Hyperlipidemia Dx 2010  . Hypertension Dx 2010  . Infection of breast implant ([place]) [id number]   staph and fungemia. breat removed in 2012 in [place]   . Left-sided chest wall pain 05/29/2014  . Obesity (BMI 30.0-34.9) 05/22/2014  . Osteoarthritis of both hands 05/30/2014  . Pancreatitis   . Pneumonia   . Right maxillary sinusitis, chronic 08/27/2009   Qualifier: Diagnosis of  By: Valma Cava LPN, Izora Gala    . S/P complete hysterectomy 05/30/2014  . Spinal headache 1990   BP dropped after spinal anes.  . 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 Tailbone injury 1990   broken   .  URINARY INCONTINENCE 06/03/2009   Qualifier: Diagnosis of  By: Elease Hashimoto MD, Bruce    . Urinary incontinence, stress     Family History  Problem Relation Age of Onset  . Coronary artery disease Mother 48  . Hypertension Mother   . Stroke Mother   . Heart disease Mother   . Congestive Heart Failure Mother   . Hypertension Father   . Diabetes Father        type 2  . Dementia Father   . Stroke  Father        deceased, passed away had over 100 mini strokes  . Ovarian cancer Maternal Aunt 80  . Schizophrenia Sister   . Diabetes Paternal Uncle   . Diabetes Paternal Grandmother   . Heart disease Maternal Grandmother   . Prostate cancer Maternal Grandfather   . Bone cancer Maternal Grandfather   . Heart disease Maternal Aunt   . Kidney disease Maternal Aunt   . Heart disease Maternal Uncle   . Heart disease Paternal Uncle   . Healthy Son   . Other Daughter        neurocariogenic syndrome NCS Pots, NCAD, EDS, C677T  . Colon polyps Sister   . Heart attack Nephew        age 61   Past Surgical History:  Procedure Laterality Date  . ABDOMINOPLASTY  1997  . ANAL FISSURE REPAIR  1985  . APPENDECTOMY  1987  . BREAST BIOPSY  2011  . BREAST ENHANCEMENT SURGERY  1997  . BREAST IMPLANT REMOVAL  6/12   implants and benign breast lumps removed  . CHOLECYSTECTOMY  1991  . LAPAROSCOPIC ASSISTED VAGINAL HYSTERECTOMY  12/20/2011   Bilateral Salpingo-Oophorectomy  . TONSILLECTOMY  1978   Social History   Social History Narrative  . Not on file   Immunization History  Administered Date(s) Administered  . Pneumococcal Polysaccharide-23 12/21/2011  . Tdap 10/09/2010     Objective: Vital Signs: LMP 12/05/2011    Physical Exam   Musculoskeletal Exam: ***  CDAI Exam: CDAI Score: -- Patient Global: --; Provider Global: -- Swollen: --; Tender: -- Joint Exam 12/13/2019   No joint exam has been documented for this visit   There is currently no information documented on the homunculus. Go to the Rheumatology activity and complete the homunculus joint exam.  Investigation: No additional findings.  Imaging: No results found.  Recent Labs: Lab Results  Component Value Date   WBC 12.2 (H) 03/31/2018   HGB 13.5 03/31/2018   PLT 345 03/31/2018   NA 138 08/22/2018   K 4.1 08/22/2018   CL 102 08/22/2018   CO2 22 08/22/2018   GLUCOSE 122 (H) 08/22/2018   BUN 11 08/22/2018    CREATININE 0.70 08/22/2018   BILITOT <0.2 03/31/2018   ALKPHOS 82 03/31/2018   AST 15 03/31/2018   ALT 23 03/31/2018   PROT 6.7 03/31/2018   ALBUMIN 4.5 03/31/2018   CALCIUM 9.8 08/22/2018   GFRAA 110 08/22/2018    Speciality Comments: No specialty comments available.  Procedures:  No procedures performed Allergies: Sulfa antibiotics, Adhesive [tape], Latex, and Pineapple flavor   Assessment / Plan:     Visit Diagnoses: No diagnosis found.  Orders: No orders of the defined types were placed in this encounter.  No orders of the defined types were placed in this encounter.   Face-to-face time spent with patient was *** minutes. Greater than 50% of time was spent in counseling and coordination of care.  Follow-Up Instructions:  No follow-ups on file.   Earnestine Mealing, [name]  Note - This record has been created using Editor, commissioning.  Chart creation errors have been sought, but may not always  have been located. Such creation errors do not reflect on  the standard of medical care.

## 2019-12-05 ENCOUNTER — Encounter: Payer: 59 | Admitting: Obstetrics and Gynecology

## 2019-12-13 ENCOUNTER — Ambulatory Visit: Payer: 59 | Admitting: Rheumatology

## 2019-12-19 ENCOUNTER — Other Ambulatory Visit: Payer: Self-pay | Admitting: Family Medicine

## 2019-12-19 ENCOUNTER — Other Ambulatory Visit: Payer: Self-pay | Admitting: Nurse Practitioner

## 2019-12-19 DIAGNOSIS — E782 Mixed hyperlipidemia: Secondary | ICD-10-CM

## 2020-01-28 IMAGING — DX DG SHOULDER 2+V*R*
3 series · 3 of 3 positions shown · non-contrast
Comparison: None.

CLINICAL DATA: Pt c/o chronic right shoulder pain x 1 month after a
tubing accident when her tube ran into some rocks and trees. Pt has
limited movement in her right shoulder.

EXAM:
RIGHT SHOULDER - 2+ VIEW

[shoulder grashey]
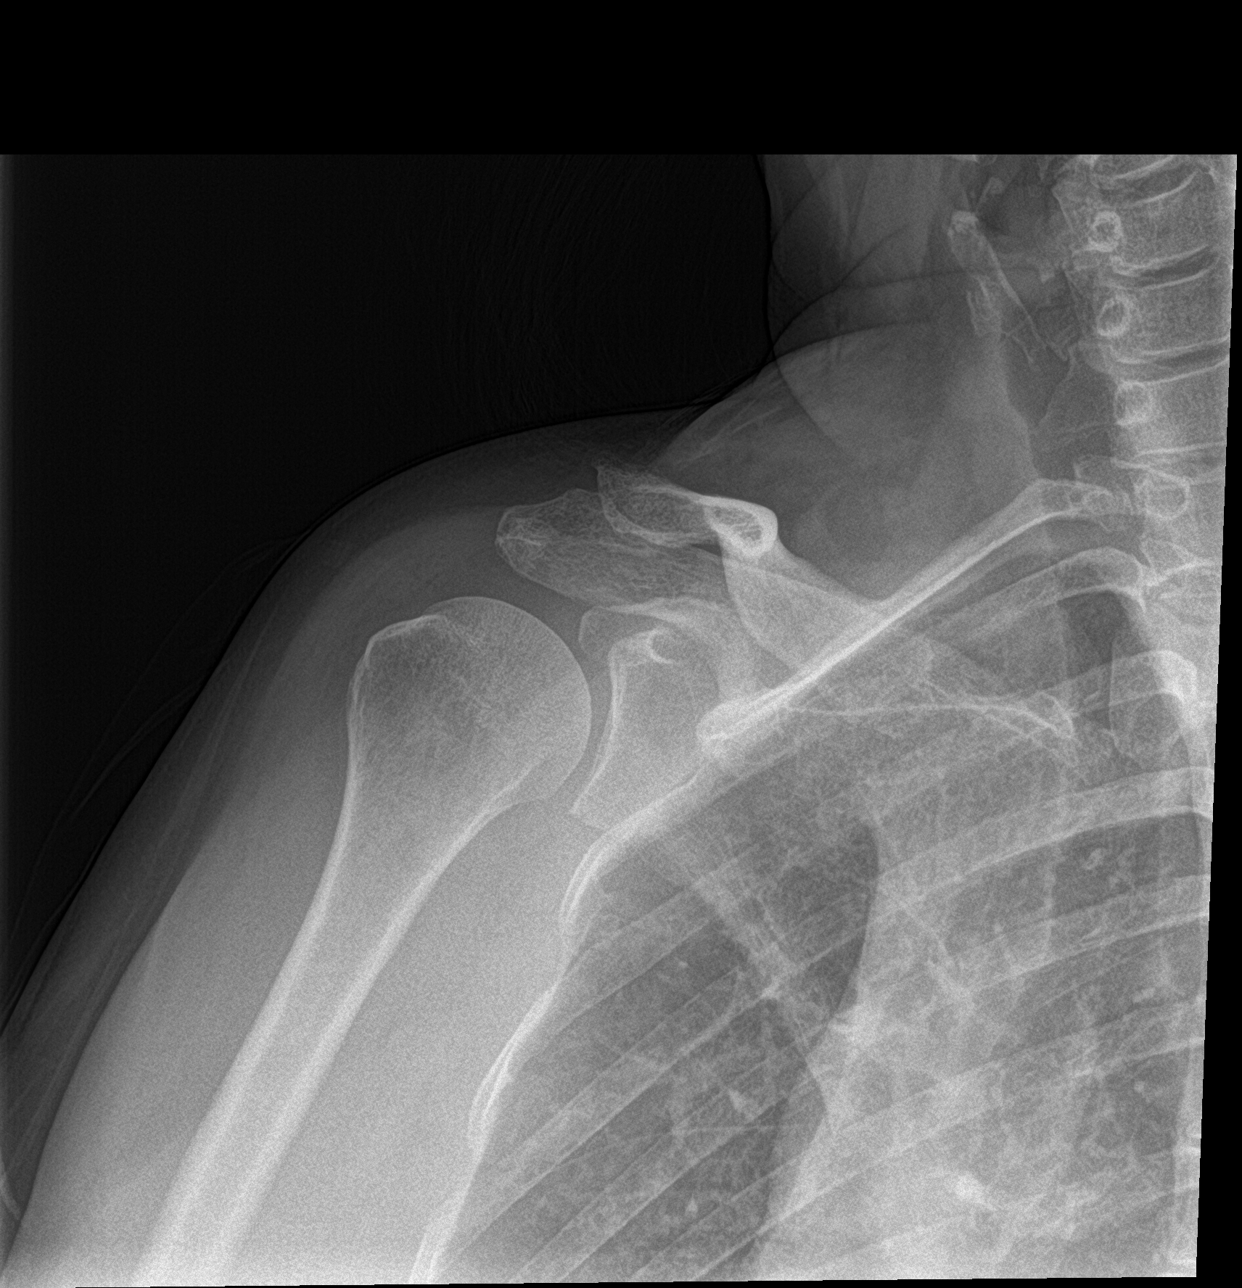

[shoulder y view]
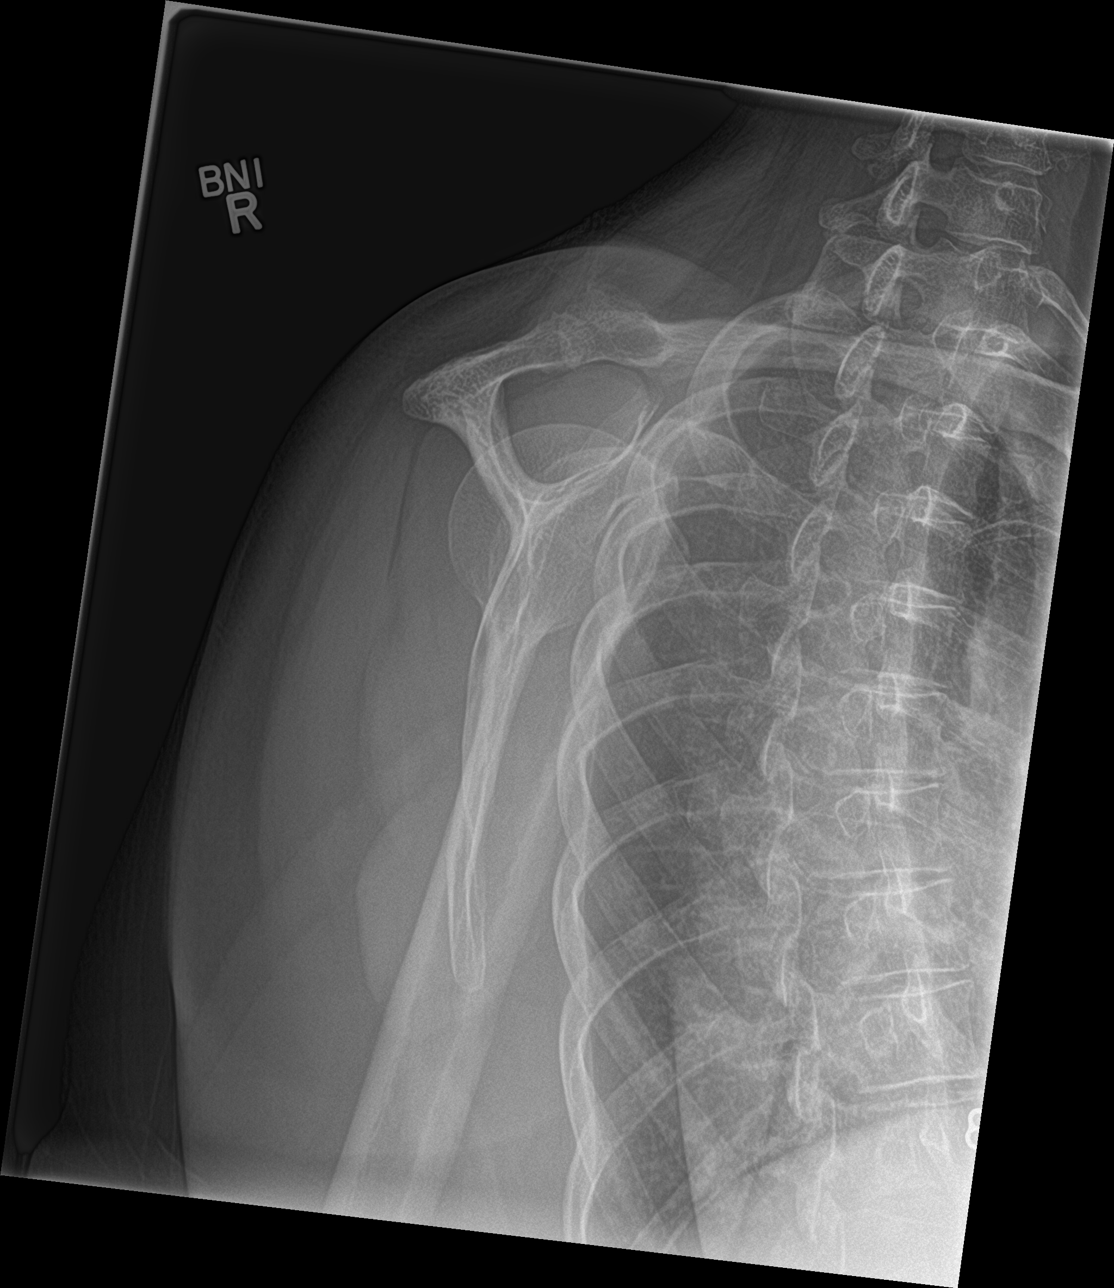

[shoulder axillary]
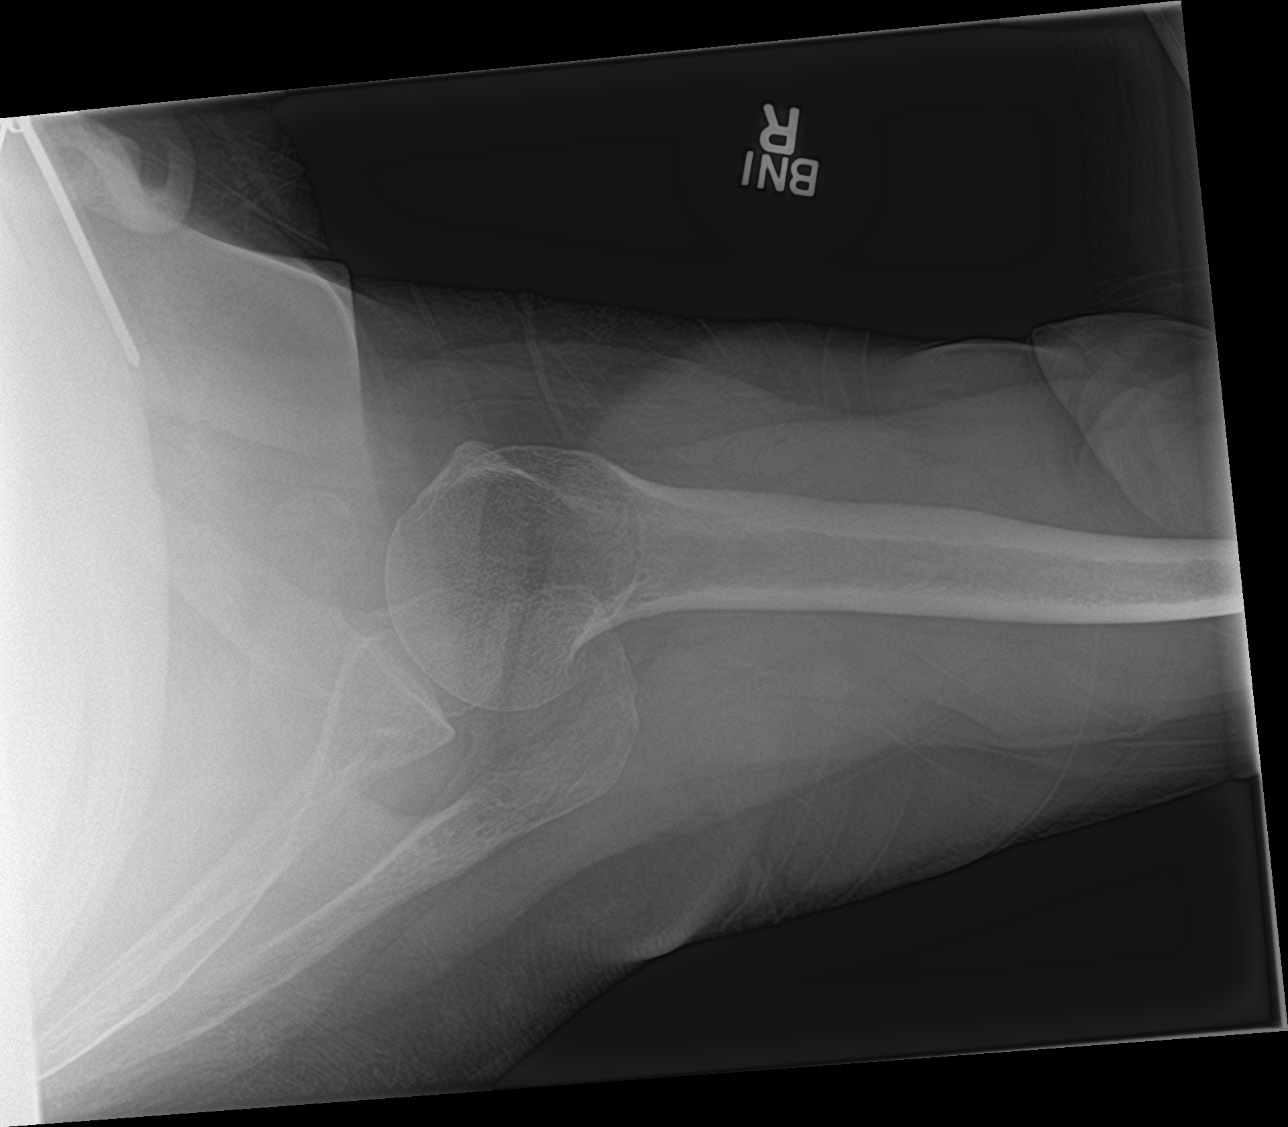

[3 of 3 positions shown; findings below may reference images not displayed]

FINDINGS: There is no evidence of fracture or dislocation. There is no
evidence of arthropathy or other focal bone abnormality. Soft
tissues are unremarkable.
IMPRESSION: Negative.

## 2020-01-29 ENCOUNTER — Other Ambulatory Visit: Payer: Self-pay | Admitting: Nurse Practitioner

## 2020-01-29 ENCOUNTER — Other Ambulatory Visit: Payer: Self-pay | Admitting: Family Medicine

## 2020-01-29 DIAGNOSIS — E782 Mixed hyperlipidemia: Secondary | ICD-10-CM

## 2020-01-29 DIAGNOSIS — F419 Anxiety disorder, unspecified: Secondary | ICD-10-CM

## 2020-01-29 DIAGNOSIS — F32A Depression, unspecified: Secondary | ICD-10-CM

## 2020-01-29 DIAGNOSIS — G8929 Other chronic pain: Secondary | ICD-10-CM

## 2020-01-29 NOTE — Telephone Encounter (Signed)
Patient called and advised of the need for an appointment. She says if she can come to the office this week that will be her only availability, since she's moving to [place], [place] and will have to find a new provider. Appointment scheduled for tomorrow at 1540 with Geryl Rankins, NP.

## 2020-01-29 NOTE — Telephone Encounter (Signed)
Requested medication (s) are due for refill today: Yes  Requested medication (s) are on the active medication list: Yes  Last refill:  7/1//21  Future visit scheduled: Yes  Notes to clinic:  Unable to refill per protocol, appointment needed     Requested Prescriptions  Pending Prescriptions Disp Refills   escitalopram (LEXAPRO) 20 MG tablet [Pharmacy Med Name: ESCITALOPRAM 20 MG TABLET 20 Tablet] 90 tablet 1    Sig: Take 1 tablet (20 mg total) by mouth daily. Must have office visit for refills      Psychiatry:  Antidepressants - SSRI Failed - 01/29/2020  5:40 PM      Failed - Valid encounter within last 6 months    Recent Outpatient Visits           7 months ago Vaginal itching   [place], [place], MD   1 year ago Dermatitis   [place], Dionne [place], [place]   1 year ago History of [place], Vernia Buff, NP   1 year ago Anxiety and depression   [place], Vernia Buff, NP   2 years ago Essential hypertension   [place], Vernia Buff, NP       Future Appointments             Tomorrow Gildardo Pounds, NP [place] - Completed PHQ-2 or PHQ-9 in the last 360 days

## 2020-01-29 NOTE — Telephone Encounter (Signed)
Requested medication (s) are due for refill today:   Yes  Requested medication (s) are on the active medication list:   Yes  Future visit scheduled:   No   Last ordered: 12/19/2019 #30, 0 refills  Returned because protocol failed due to needed lab work.     Requested Prescriptions  Pending Prescriptions Disp Refills   atorvastatin (LIPITOR) 20 MG tablet [Pharmacy Med Name: ATORVASTATIN CALCIUM 20 MG 20 Tablet] 30 tablet 0    Sig: TAKE 1 TABLET (20 MG TOTAL) BY MOUTH DAILY. AFTER EVENING MEAL      Cardiovascular:  Antilipid - Statins Failed - 01/29/2020  2:54 PM      Failed - Total Cholesterol in normal range and within 360 days    Cholesterol, Total  Date Value Ref Range Status  03/31/2018 177 100 - 199 mg/dL Final          Failed - LDL in normal range and within 360 days    LDL Calculated  Date Value Ref Range Status  03/31/2018 75 0 - 99 mg/dL Final          Failed - HDL in normal range and within 360 days    HDL  Date Value Ref Range Status  03/31/2018 51 >39 mg/dL Final          Failed - Triglycerides in normal range and within 360 days    Triglycerides  Date Value Ref Range Status  03/31/2018 254 (H) 0 - 149 mg/dL Final          Passed - Patient is not pregnant      Passed - Valid encounter within last 12 months    Recent Outpatient Visits           7 months ago Vaginal itching   [place], [place], MD   1 year ago Dermatitis   [place], [place], [place]   1 year ago History of fibromyalgia   [place], Vernia Buff, NP   1 year ago Anxiety and depression   [place], Vernia Buff, NP   2 years ago Essential hypertension   [place], Vernia Buff, NP

## 2020-01-29 NOTE — Telephone Encounter (Signed)
Requested medication (s) are due for refill today: Yes  Requested medication (s) are on the active medication list: Yes  Last refill:  7 months ago  Future visit scheduled: Yes  Notes to clinic:  Unable to refill per protocol, cannot delegate     Requested Prescriptions  Pending Prescriptions Disp Refills   tiZANidine (ZANAFLEX) 4 MG tablet [Pharmacy Med Name: tiZANidine HCL 4 MG TABS 4 Tablet] 60 tablet 2    Sig: TAKE 1 TABLET BY MOUTH TWO TIMES DAILY AS NEEDED FOR MUSCLE SPASMS      Not Delegated - Cardiovascular:  Alpha-2 Agonists - tizanidine Failed - 01/29/2020  4:33 PM      Failed - This refill cannot be delegated      Failed - Valid encounter within last 6 months    Recent Outpatient Visits           7 months ago Vaginal itching   [place], [place], MD   1 year ago Dermatitis   [place], [place]   1 year ago History of fibromyalgia   [place], Vernia Buff, NP   1 year ago Anxiety and depression   [place], Vernia Buff, NP   2 years ago Essential hypertension   [place], Vernia Buff, NP       Future Appointments             Tomorrow Gildardo Pounds, NP [place]

## 2020-01-30 ENCOUNTER — Encounter: Payer: 59 | Admitting: Obstetrics and Gynecology

## 2020-01-30 ENCOUNTER — Ambulatory Visit: Payer: 59 | Attending: Nurse Practitioner | Admitting: Nurse Practitioner

## 2020-01-30 ENCOUNTER — Other Ambulatory Visit: Payer: Self-pay

## 2020-01-30 ENCOUNTER — Encounter: Payer: Self-pay | Admitting: Nurse Practitioner

## 2020-01-30 ENCOUNTER — Other Ambulatory Visit: Payer: Self-pay | Admitting: Nurse Practitioner

## 2020-01-30 VITALS — BP 105/67 | HR 70 | Temp 96.4°F | Ht 59.75 in | Wt 164.0 lb

## 2020-01-30 DIAGNOSIS — E669 Obesity, unspecified: Secondary | ICD-10-CM

## 2020-01-30 DIAGNOSIS — Z130 Encounter for screening for diseases of the blood and blood-forming organs and certain disorders involving the immune mechanism: Secondary | ICD-10-CM

## 2020-01-30 DIAGNOSIS — Z87898 Personal history of other specified conditions: Secondary | ICD-10-CM | POA: Diagnosis not present

## 2020-01-30 DIAGNOSIS — F419 Anxiety disorder, unspecified: Secondary | ICD-10-CM | POA: Diagnosis not present

## 2020-01-30 DIAGNOSIS — I10 Essential (primary) hypertension: Secondary | ICD-10-CM

## 2020-01-30 DIAGNOSIS — F32A Depression, unspecified: Secondary | ICD-10-CM

## 2020-01-30 LAB — POCT GLYCOSYLATED HEMOGLOBIN (HGB A1C): Hemoglobin A1C: 5.8 % — AB (ref 4.0–5.6)

## 2020-01-30 LAB — GLUCOSE, POCT (MANUAL RESULT ENTRY): POC Glucose: 87 mg/dl (ref 70–99)

## 2020-01-30 MED ORDER — ESCITALOPRAM OXALATE 5 MG PO TABS: 5.0000 mg | ORAL_TABLET | Freq: Every day | ORAL | 0 refills | Status: AC

## 2020-01-30 MED ORDER — ESCITALOPRAM OXALATE 20 MG PO TABS: 20.0000 mg | ORAL_TABLET | Freq: Every day | ORAL | 1 refills | Status: DC

## 2020-01-30 MED ORDER — NEBIVOLOL HCL 5 MG PO TABS: ORAL_TABLET | ORAL | 2 refills | Status: AC

## 2020-01-30 NOTE — Progress Notes (Signed)
Assessment & Plan:  Rose Robertson was seen today for follow-up.  Diagnoses and all orders for this visit:  Essential hypertension -     nebivolol (BYSTOLIC) 5 MG tablet; TAKE 1 TABLET (5 MG TOTAL) BY MOUTH DAILY. Please fill as a 90 day supply -     CMP14+EGFR Continue all antihypertensives as prescribed.  Remember to bring in your blood pressure log with you for your follow up appointment.  DASH/Mediterranean Diets are healthier choices for HTN.    Anxiety and depression -     escitalopram (LEXAPRO) 20 MG tablet; Take 1 tablet (20 mg total) by mouth daily. Please fill as a 90 day supply -     escitalopram (LEXAPRO) 5 MG tablet; Take 1 tablet (5 mg total) by mouth daily. Please fill as a 90 day supply  History of prediabetes -     Glucose (CBG) -     HgB A1c Continue blood sugar control as discussed in office today, low carbohydrate diet, and regular physical exercise as tolerated, 150 minutes per week (30 min each day, 5 days per week, or 50 min 3 days per week).    Obesity (BMI 30.0-34.9) -     Lipid panel  Screening for deficiency anemia -     CBC    Patient has been counseled on age-appropriate routine health concerns for screening and prevention. These are reviewed and up-to-date. Referrals have been placed accordingly. Immunizations are up-to-date or declined.    Subjective:   Chief Complaint  Patient presents with  . Follow-up    Pt. is here for hypertension follow up.    HPI Rose Robertson 59 y.o. female presents to office today for follow up. She is doing well today. Will be moving out of the city to a new home and establishing care with a new provider. PMH: Asthma, anxiety and depression, Fibromyalgia syndrome (taking gabapentin) GERD, Hyperlipidemia, Hypertension, Infection of breast implant Pancreatitis, URINARY Stress INCONTINENCE  Essential Hypertension She is currently taking bystolic 5 mg daily as prescribed.  Denies chest pain, shortness  of breath, palpitations, lightheadedness, dizziness, headaches or BLE edema.  BP Readings from Last 3 Encounters:  01/30/20 105/67  06/29/19 118/64  05/09/19 123/82   Anxiety and Depression Notes improvement in depression however still with episodes of breakthrough anxiety. Would like to increase lexapro from 20 to 25 mg daily.  Depression screen [facility] 2/9 01/30/2020 06/29/2019 11/30/2018 03/31/2018 02/17/2018  Decreased Interest 2 2 - 3 2  Down, Depressed, Hopeless 2 2 3 3 3   PHQ - 2 Score 4 4 3 6 5   Altered sleeping 3 3 2 2 3   Tired, decreased energy 3 3 2 3 2   Change in appetite 3 3 0 2 1  Feeling bad or failure about yourself  2 3 2 3 3   Trouble concentrating 3 2 0 2 2  Moving slowly or fidgety/restless 0 0 0 0 2  Suicidal thoughts 0 0 0 0 0  PHQ-9 Score 18 18 9 18 18   Difficult doing work/chores - Extremely dIfficult Somewhat difficult - -   GAD 7 : Generalized Anxiety Score 01/30/2020 06/29/2019 03/31/2018 02/17/2018  Nervous, Anxious, on Edge 3 2 3 3   Control/stop worrying 2 2 3 2   Worry too much - different things 3 3 3 2   Trouble relaxing 2 3 2 3   Restless 0 1 0 1  Easily annoyed or irritable 1 1 2 2   Afraid - awful might happen 2 2 3  3  Total GAD 7 Score $Remov'13 14 16 16  'HZkzkQ$ Anxiety Difficulty - Very difficult - -   Prediabetes Diet controlled at this time. Lab Results  Component Value Date   HGBA1C 5.8 (A) 01/30/2020     Review of Systems  Constitutional: Negative for fever, malaise/fatigue and weight loss.  HENT: Negative.  Negative for nosebleeds.   Eyes: Negative.  Negative for blurred vision, double vision and photophobia.  Respiratory: Negative.  Negative for cough and shortness of breath.   Cardiovascular: Negative.  Negative for chest pain, palpitations and leg swelling.  Gastrointestinal: Negative.  Negative for heartburn, nausea and vomiting.  Musculoskeletal: Positive for myalgias.  Neurological: Negative.  Negative for dizziness, focal weakness, seizures and  headaches.  Psychiatric/Behavioral: Positive for depression. Negative for suicidal ideas. The patient is nervous/anxious.     Past Medical History:  Diagnosis Date  . Abdominal wall pain in left flank 05/21/2014  . Anal fissure   . Asthma Dx 1991   no inhaler  . ASTHMA 06/03/2009   Qualifier: Diagnosis of  By: Elease Hashimoto MD, Bruce    . BREAST IMPLANTS, BILATERAL, HX OF 10/01/2009   Qualifier: Diagnosis of  By: Elease Hashimoto MD, Bruce    . C. difficile diarrhea   . Chronic left hip pain 05/22/2014  . Complete tear of right rotator cuff 11/22/2017  . Current mild episode of major depressive disorder without prior episode ([place]) 04/01/2018  . Depression    At age of 19  . DEPRESSION 06/03/2009   Qualifier: Diagnosis of  By: Elease Hashimoto MD, Bruce    . 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 Dysrhythmia    tachycardia -controlled by med  . FH: colon polyps 12/30/2010  . Fibromyalgia Dx 2010  . Fibromyalgia syndrome 11/06/2010  . GERD (gastroesophageal reflux disease) Dx 2008  . HEADACHES, HX OF 06/30/2009   Qualifier: Diagnosis of  By: Elease Hashimoto MD, Bruce    . Healthcare maintenance 05/30/2014  . Hyperlipidemia Dx 2010  . Hypertension Dx 2010  . Infection of breast implant ([place]) [id number]   staph and fungemia. breat removed in 2012 in [place]   . Left-sided chest wall pain 05/29/2014  . Obesity (BMI 30.0-34.9) 05/22/2014  . Osteoarthritis of both hands 05/30/2014  . Pancreatitis   . Pneumonia   . Right maxillary sinusitis, chronic 08/27/2009   Qualifier: Diagnosis of  By: Valma Cava LPN, Izora Gala    . S/P complete hysterectomy 05/30/2014  . Spinal headache 1990   BP dropped after spinal anes.  . 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 Tailbone injury 1990   broken   . URINARY INCONTINENCE 06/03/2009   Qualifier: Diagnosis of  By: Elease Hashimoto MD, Bruce    . Urinary incontinence, stress     Past Surgical History:  Procedure Laterality Date  . ABDOMINOPLASTY  1997   . ANAL FISSURE REPAIR  1985  . APPENDECTOMY  1987  . BREAST BIOPSY  2011  . BREAST ENHANCEMENT SURGERY  1997  . BREAST IMPLANT REMOVAL  6/12   implants and benign breast lumps removed  . CHOLECYSTECTOMY  1991  . LAPAROSCOPIC ASSISTED VAGINAL HYSTERECTOMY  12/20/2011   Bilateral Salpingo-Oophorectomy  . TONSILLECTOMY  1978    Family History  Problem Relation Age of Onset  . Coronary artery disease Mother 35  . Hypertension Mother   . Stroke Mother   . Heart disease Mother   . Congestive Heart Failure Mother   . Hypertension Father   . Diabetes  Father        type 2  . Dementia Father   . Stroke Father        deceased, passed away had over 100 mini strokes  . Ovarian cancer Maternal Aunt 80  . Schizophrenia Sister   . Diabetes Paternal Uncle   . Diabetes Paternal Grandmother   . Heart disease Maternal Grandmother   . Prostate cancer Maternal Grandfather   . Bone cancer Maternal Grandfather   . Heart disease Maternal Aunt   . Kidney disease Maternal Aunt   . Heart disease Maternal Uncle   . Heart disease Paternal Uncle   . Healthy Son   . Other Daughter        neurocariogenic syndrome NCS Pots, NCAD, EDS, C677T  . Colon polyps Sister   . Heart attack Nephew        age 2    Social History Reviewed with no changes to be made today.   Outpatient Medications Prior to Visit  Medication Sig Dispense Refill  . Ascorbic Acid (VITAMIN C PO) Take 1,000 mg by mouth daily.    . CHLOROPHYLL PO Take by mouth.    . cholecalciferol (VITAMIN D) 1000 UNITS tablet Take 5,000 Units by mouth daily.     . Cyanocobalamin (VITAMIN B-12 PO) Take by mouth.    . fish oil-omega-3 fatty acids 1000 MG capsule Take 1,200 mg by mouth daily.    Marland Kitchen gabapentin (NEURONTIN) 300 MG capsule Take 1 capsule (300 mg total) by mouth 3 (three) times daily. 90 capsule 3  . Hyoscyamine Sulfate SL (LEVSIN/SL) 0.125 MG SUBL Take 1 tablet every 8 hours as needed 120 tablet 1  . pantoprazole (PROTONIX) 40 MG  tablet TAKE 1 TABLET (40 MG TOTAL) BY MOUTH DAILY. MUST HAVE OFFICE VISIT FOR REFILLS 90 tablet 1  . triamcinolone cream (KENALOG) 0.1 % Apply 1 application topically 2 (two) times daily. 30 g 0  . atorvastatin (LIPITOR) 20 MG tablet TAKE 1 TABLET (20 MG TOTAL) BY MOUTH DAILY. AFTER EVENING MEAL 30 tablet 0  . escitalopram (LEXAPRO) 20 MG tablet Take 1 tablet (20 mg total) by mouth daily. Must have office visit for refills 90 tablet 1  . nebivolol (BYSTOLIC) 5 MG tablet TAKE 1 TABLET (5 MG TOTAL) BY MOUTH DAILY. 90 tablet 2  . tiZANidine (ZANAFLEX) 4 MG tablet Twice daily as needed for muscle spasm 60 tablet 2   No facility-administered medications prior to visit.    Allergies  Allergen Reactions  . Sulfa Antibiotics     Remo Lipps Johnson's syndrome  . Adhesive [Tape] Other (See Comments)    Blisters   . Latex     rash  . Pineapple Flavor        Objective:    BP 105/67 (BP Location: Left Arm, Patient Position: Sitting, Cuff Size: Normal)   Pulse 70   Temp (!) 96.4 F (35.8 C) (Temporal)   Ht 4' 11.75" (1.518 m)   Wt 164 lb (74.4 kg)   LMP 12/05/2011   SpO2 98%   BMI 32.30 kg/m  Wt Readings from Last 3 Encounters:  01/30/20 164 lb (74.4 kg)  06/29/19 168 lb 6.4 oz (76.4 kg)  05/09/19 169 lb (76.7 kg)    Physical Exam Vitals and nursing note reviewed.  Constitutional:      Appearance: She is well-developed.  HENT:     Head: Normocephalic and atraumatic.  Cardiovascular:     Rate and Rhythm: Normal rate and regular rhythm.  Heart sounds: Normal heart sounds. No murmur heard.  No friction rub. No gallop.   Pulmonary:     Effort: Pulmonary effort is normal. No tachypnea or respiratory distress.     Breath sounds: Normal breath sounds. No decreased breath sounds, wheezing, rhonchi or rales.  Chest:     Chest wall: No tenderness.  Abdominal:     General: Bowel sounds are normal.     Palpations: Abdomen is soft.  Musculoskeletal:        General: Normal range of  motion.     Cervical back: Normal range of motion.  Skin:    General: Skin is warm and dry.  Neurological:     Mental Status: She is alert and oriented to person, place, and time.     Coordination: Coordination normal.  Psychiatric:        Behavior: Behavior normal. Behavior is cooperative.        Thought Content: Thought content normal.        Judgment: Judgment normal.          Patient has been counseled extensively about nutrition and exercise as well as the importance of adherence with medications and regular follow-up. The patient was given clear instructions to go to ER or return to medical center if symptoms don't improve, worsen or new problems develop. The patient verbalized understanding.   Follow-up: No follow-ups on file.   Gildardo Pounds, FNP-BC [facility] [place], [place]   01/30/2020, 7:41 PM

## 2020-01-30 NOTE — Telephone Encounter (Signed)
Not our pt

## 2020-01-31 LAB — CMP14+EGFR
ALT: 21 IU/L (ref 0–32)
AST: 21 IU/L (ref 0–40)
Albumin/Globulin Ratio: 1.9 (ref 1.2–2.2)
Albumin: 5 g/dL — ABNORMAL HIGH (ref 3.8–4.9)
Alkaline Phosphatase: 95 IU/L (ref 44–121)
BUN/Creatinine Ratio: 17 (ref 9–23)
BUN: 12 mg/dL (ref 6–24)
Bilirubin Total: 0.3 mg/dL (ref 0.0–1.2)
CO2: 24 mmol/L (ref 20–29)
Calcium: 10.3 mg/dL — ABNORMAL HIGH (ref 8.7–10.2)
Chloride: 100 mmol/L (ref 96–106)
Creatinine, Ser: 0.69 mg/dL (ref 0.57–1.00)
GFR calc Af Amer: 110 mL/min/{1.73_m2} (ref >59)
GFR calc non Af Amer: 96 mL/min/{1.73_m2} (ref >59)
Globulin, Total: 2.6 g/dL (ref 1.5–4.5)
Glucose: 89 mg/dL (ref 65–99)
Potassium: 4.5 mmol/L (ref 3.5–5.2)
Sodium: 139 mmol/L (ref 134–144)
Total Protein: 7.6 g/dL (ref 6.0–8.5)

## 2020-01-31 LAB — LIPID PANEL
Chol/HDL Ratio: 4 ratio (ref 0.0–4.4)
Cholesterol, Total: 201 mg/dL — ABNORMAL HIGH (ref 100–199)
HDL: 50 mg/dL (ref >39)
LDL Chol Calc (NIH): 118 mg/dL — ABNORMAL HIGH (ref 0–99)
Triglycerides: 186 mg/dL — ABNORMAL HIGH (ref 0–149)
VLDL Cholesterol Cal: 33 mg/dL (ref 5–40)

## 2020-01-31 LAB — CBC
Hematocrit: 42.8 % (ref 34.0–46.6)
Hemoglobin: 15 g/dL (ref 11.1–15.9)
MCH: 30.9 pg (ref 26.6–33.0)
MCHC: 35 g/dL (ref 31.5–35.7)
MCV: 88 fL (ref 79–97)
Platelets: 387 10*3/uL (ref 150–450)
RBC: 4.85 x10E6/uL (ref 3.77–5.28)
RDW: 12.2 % (ref 11.7–15.4)
WBC: 12.5 10*3/uL — ABNORMAL HIGH (ref 3.4–10.8)

## 2020-04-30 ENCOUNTER — Other Ambulatory Visit: Payer: Self-pay | Admitting: Nurse Practitioner

## 2020-04-30 ENCOUNTER — Other Ambulatory Visit: Payer: Self-pay | Admitting: Pharmacy Technician

## 2020-05-27 ENCOUNTER — Telehealth: Payer: Self-pay | Admitting: Family Medicine

## 2020-05-27 DIAGNOSIS — K219 Gastro-esophageal reflux disease without esophagitis: Secondary | ICD-10-CM

## 2020-05-27 NOTE — Telephone Encounter (Signed)
Requested medication (s) are due for refill today: yes  Requested medication (s) are on the active medication list: yes  Last refill:  09/26/19  Future visit scheduled: no  Notes to clinic:  note stated that needs OV when script was written. Last OV that addressed issue 06/29/19.   Requested Prescriptions  Pending Prescriptions Disp Refills   pantoprazole (PROTONIX) 40 MG tablet [Pharmacy Med Name: PANTOPRAZOLE SOD DR 40 MG T 40 Tablet] 60 tablet 1    Sig: TAKE 1 TABLET (40 MG TOTAL) BY MOUTH DAILY. MUST HAVE OFFICE VISIT FOR REFILLS      Gastroenterology: Proton Pump Inhibitors Passed - 05/27/2020  4:55 PM      Passed - Valid encounter within last 12 months    Recent Outpatient Visits           3 months ago Essential hypertension   [place], Vernia Buff, NP   11 months ago Vaginal itching   [place], [place], MD   1 year ago [place], Dionne Bucy, [place]   2 years ago [facility], Vernia Buff, NP   2 years ago Anxiety and depression   [place], Vernia Buff, NP

## 2020-05-28 ENCOUNTER — Other Ambulatory Visit: Payer: Self-pay | Admitting: Family Medicine

## 2020-06-02 ENCOUNTER — Other Ambulatory Visit: Payer: Self-pay

## 2020-06-06 ENCOUNTER — Other Ambulatory Visit: Payer: Self-pay

## 2020-06-06 MED FILL — Gabapentin Cap 300 MG: ORAL | 30 days supply | Qty: 90 | Fill #0 | Status: AC

## 2020-06-06 MED FILL — Nebivolol HCl Tab 5 MG (Base Equivalent): ORAL | 30 days supply | Qty: 30 | Fill #0 | Status: AC

## 2020-06-09 ENCOUNTER — Other Ambulatory Visit: Payer: Self-pay

## 2020-07-02 ENCOUNTER — Other Ambulatory Visit: Payer: Self-pay

## 2020-07-02 MED FILL — Pantoprazole Sodium EC Tab 40 MG (Base Equiv): ORAL | 30 days supply | Qty: 30 | Fill #0 | Status: CN

## 2020-07-02 MED FILL — Tizanidine HCl Tab 4 MG (Base Equivalent): ORAL | 30 days supply | Qty: 60 | Fill #0 | Status: AC

## 2020-07-03 ENCOUNTER — Other Ambulatory Visit: Payer: Self-pay

## 2020-07-07 ENCOUNTER — Other Ambulatory Visit: Payer: Self-pay | Admitting: Nurse Practitioner

## 2020-07-07 ENCOUNTER — Other Ambulatory Visit: Payer: Self-pay

## 2020-07-07 DIAGNOSIS — I10 Essential (primary) hypertension: Secondary | ICD-10-CM

## 2020-07-07 MED ORDER — NEBIVOLOL HCL 5 MG PO TABS
ORAL_TABLET | Freq: Every day | ORAL | 0 refills | Status: AC
  Filled 2020-07-07: qty 90, 90d supply, fill #0

## 2020-07-07 NOTE — Telephone Encounter (Signed)
Patient needs to make appointment before next refill. Requested Prescriptions  Pending Prescriptions Disp Refills  . nebivolol (BYSTOLIC) 5 MG tablet 90 tablet 0    Sig: TAKE 1 TABLET (5 MG TOTAL) BY MOUTH DAILY.     Cardiovascular:  Beta Blockers Passed - 07/07/2020  2:27 PM      Passed - Last BP in normal range    BP Readings from Last 1 Encounters:  01/30/20 105/67         Passed - Last Heart Rate in normal range    Pulse Readings from Last 1 Encounters:  01/30/20 70         Passed - Valid encounter within last 6 months    Recent Outpatient Visits          5 months ago Essential hypertension   [place], Vernia Buff, NP   1 year ago Vaginal itching   [place], [place], MD   1 year ago Dermatitis   [place], [place], [place]   2 years ago [facility], Vernia Buff, NP   2 years ago Anxiety and depression   [place], Vernia Buff, NP

## 2020-07-08 ENCOUNTER — Other Ambulatory Visit: Payer: Self-pay

## 2020-07-09 ENCOUNTER — Other Ambulatory Visit: Payer: Self-pay

## 2020-07-09 NOTE — Telephone Encounter (Signed)
Patient called in today and stated that in order for her insurance to cover pantoprazole her insurance company needs documentation that she has tried OTC medications to control her symptoms. Please advise.

## 2020-07-10 NOTE — Telephone Encounter (Signed)
Pt is needing documentation stating she has tried OTC medication and failed.

## 2020-07-11 NOTE — Telephone Encounter (Signed)
She can send me a mychart message directly of medications she has tried in the past

## 2020-07-21 ENCOUNTER — Other Ambulatory Visit: Payer: Self-pay

## 2020-07-21 MED FILL — Pantoprazole Sodium EC Tab 40 MG (Base Equiv): ORAL | 30 days supply | Qty: 30 | Fill #0 | Status: CN

## 2020-08-22 ENCOUNTER — Other Ambulatory Visit: Payer: Self-pay | Admitting: Nurse Practitioner

## 2020-08-22 ENCOUNTER — Other Ambulatory Visit: Payer: Self-pay

## 2020-08-22 DIAGNOSIS — F419 Anxiety disorder, unspecified: Secondary | ICD-10-CM

## 2020-08-22 NOTE — Telephone Encounter (Signed)
Requested medications are due for refill today yes  Requested medications are on the active medication list yes  Last refill 3/29  Last visit 01/2020  Future visit scheduled no  Notes to clinic No signature on rx, please assess.

## 2020-08-27 ENCOUNTER — Other Ambulatory Visit: Payer: Self-pay

## 2021-04-28 ENCOUNTER — Encounter: Payer: Self-pay | Admitting: Gastroenterology
